# Patient Record
Sex: Female | Born: 1950 | Race: White | Hispanic: No | Marital: Married | Smoking: Never smoker
Health system: Southern US, Community
[De-identification: ages and names within clinical notes are randomized; demographics above are authoritative.]

## PROBLEM LIST (undated history)

## (undated) DIAGNOSIS — E042 Nontoxic multinodular goiter: Secondary | ICD-10-CM

## (undated) DIAGNOSIS — J45909 Unspecified asthma, uncomplicated: Secondary | ICD-10-CM

## (undated) DIAGNOSIS — I10 Essential (primary) hypertension: Secondary | ICD-10-CM

## (undated) DIAGNOSIS — Z8719 Personal history of other diseases of the digestive system: Secondary | ICD-10-CM

## (undated) DIAGNOSIS — J189 Pneumonia, unspecified organism: Secondary | ICD-10-CM

## (undated) DIAGNOSIS — E039 Hypothyroidism, unspecified: Secondary | ICD-10-CM

## (undated) DIAGNOSIS — M5430 Sciatica, unspecified side: Secondary | ICD-10-CM

## (undated) DIAGNOSIS — E785 Hyperlipidemia, unspecified: Secondary | ICD-10-CM

## (undated) DIAGNOSIS — G4730 Sleep apnea, unspecified: Secondary | ICD-10-CM

## (undated) DIAGNOSIS — M8580 Other specified disorders of bone density and structure, unspecified site: Secondary | ICD-10-CM

## (undated) DIAGNOSIS — K219 Gastro-esophageal reflux disease without esophagitis: Secondary | ICD-10-CM

## (undated) DIAGNOSIS — E063 Autoimmune thyroiditis: Secondary | ICD-10-CM

## (undated) DIAGNOSIS — M1990 Unspecified osteoarthritis, unspecified site: Secondary | ICD-10-CM

## (undated) HISTORY — PX: ECTOPIC PREGNANCY SURGERY: SHX613

## (undated) HISTORY — PX: COLONOSCOPY: SHX174

## (undated) HISTORY — PX: UPPER GI ENDOSCOPY: SHX6162

---

## 1997-12-22 ENCOUNTER — Encounter: Payer: Self-pay | Admitting: Obstetrics and Gynecology

## 1997-12-22 ENCOUNTER — Ambulatory Visit (HOSPITAL_COMMUNITY): Admission: AD | Admit: 1997-12-22 | Discharge: 1997-12-22 | Payer: Self-pay | Admitting: Obstetrics and Gynecology

## 1998-01-22 ENCOUNTER — Other Ambulatory Visit: Admission: RE | Admit: 1998-01-22 | Discharge: 1998-01-22 | Payer: Self-pay | Admitting: Obstetrics and Gynecology

## 1998-08-19 ENCOUNTER — Inpatient Hospital Stay (HOSPITAL_COMMUNITY): Admission: AD | Admit: 1998-08-19 | Discharge: 1998-08-21 | Payer: Self-pay | Admitting: Obstetrics and Gynecology

## 1998-10-09 ENCOUNTER — Other Ambulatory Visit: Admission: RE | Admit: 1998-10-09 | Discharge: 1998-10-09 | Payer: Self-pay | Admitting: Obstetrics and Gynecology

## 2000-01-16 ENCOUNTER — Ambulatory Visit (HOSPITAL_COMMUNITY): Admission: RE | Admit: 2000-01-16 | Discharge: 2000-01-16 | Payer: Self-pay | Admitting: Gastroenterology

## 2000-01-27 ENCOUNTER — Other Ambulatory Visit: Admission: RE | Admit: 2000-01-27 | Discharge: 2000-01-27 | Payer: Self-pay | Admitting: Obstetrics and Gynecology

## 2001-06-14 ENCOUNTER — Other Ambulatory Visit: Admission: RE | Admit: 2001-06-14 | Discharge: 2001-06-14 | Payer: Self-pay | Admitting: Obstetrics and Gynecology

## 2003-02-27 ENCOUNTER — Other Ambulatory Visit: Admission: RE | Admit: 2003-02-27 | Discharge: 2003-02-27 | Payer: Self-pay | Admitting: Obstetrics and Gynecology

## 2004-04-24 ENCOUNTER — Other Ambulatory Visit: Admission: RE | Admit: 2004-04-24 | Discharge: 2004-04-24 | Payer: Self-pay | Admitting: Obstetrics and Gynecology

## 2007-06-07 ENCOUNTER — Ambulatory Visit (HOSPITAL_COMMUNITY): Admission: RE | Admit: 2007-06-07 | Discharge: 2007-06-07 | Payer: Self-pay | Admitting: Obstetrics and Gynecology

## 2007-08-04 ENCOUNTER — Encounter (INDEPENDENT_AMBULATORY_CARE_PROVIDER_SITE_OTHER): Payer: Self-pay | Admitting: Interventional Radiology

## 2007-08-04 ENCOUNTER — Encounter: Admission: RE | Admit: 2007-08-04 | Discharge: 2007-08-04 | Payer: Self-pay | Admitting: Surgery

## 2007-08-04 ENCOUNTER — Other Ambulatory Visit: Admission: RE | Admit: 2007-08-04 | Discharge: 2007-08-04 | Payer: Self-pay | Admitting: Interventional Radiology

## 2009-07-23 ENCOUNTER — Encounter: Admission: RE | Admit: 2009-07-23 | Discharge: 2009-07-23 | Payer: Self-pay | Admitting: Obstetrics and Gynecology

## 2011-01-26 ENCOUNTER — Encounter (INDEPENDENT_AMBULATORY_CARE_PROVIDER_SITE_OTHER): Payer: Self-pay | Admitting: Surgery

## 2011-07-06 ENCOUNTER — Encounter (INDEPENDENT_AMBULATORY_CARE_PROVIDER_SITE_OTHER): Payer: Self-pay

## 2012-01-04 ENCOUNTER — Other Ambulatory Visit (HOSPITAL_COMMUNITY): Payer: Self-pay | Admitting: Obstetrics and Gynecology

## 2012-01-04 DIAGNOSIS — Z8639 Personal history of other endocrine, nutritional and metabolic disease: Secondary | ICD-10-CM

## 2012-01-06 ENCOUNTER — Ambulatory Visit (HOSPITAL_COMMUNITY): Payer: Self-pay

## 2012-01-12 ENCOUNTER — Ambulatory Visit (HOSPITAL_COMMUNITY)
Admission: RE | Admit: 2012-01-12 | Discharge: 2012-01-12 | Disposition: A | Discharge status: Home / Self Care | Payer: BC Managed Care – PPO | Source: Ambulatory Visit | Attending: Obstetrics and Gynecology | Admitting: Obstetrics and Gynecology

## 2012-01-12 DIAGNOSIS — Z8639 Personal history of other endocrine, nutritional and metabolic disease: Secondary | ICD-10-CM | POA: Insufficient documentation

## 2012-01-12 DIAGNOSIS — E049 Nontoxic goiter, unspecified: Secondary | ICD-10-CM | POA: Insufficient documentation

## 2012-01-12 DIAGNOSIS — Z862 Personal history of diseases of the blood and blood-forming organs and certain disorders involving the immune mechanism: Secondary | ICD-10-CM | POA: Insufficient documentation

## 2012-03-22 ENCOUNTER — Encounter: Payer: Self-pay | Admitting: Physician Assistant

## 2012-03-22 ENCOUNTER — Ambulatory Visit (INDEPENDENT_AMBULATORY_CARE_PROVIDER_SITE_OTHER): Payer: BC Managed Care – PPO | Admitting: Physician Assistant

## 2012-03-22 DIAGNOSIS — R079 Chest pain, unspecified: Secondary | ICD-10-CM

## 2012-03-22 NOTE — Patient Instructions (Addendum)
Your physician has requested that you have a lexiscan myoview DX ABN GXT. For further information please visit [email or web address] Please follow instruction sheet, as given.

## 2012-03-22 NOTE — Progress Notes (Signed)
Melissa Collier is a 62 y.o. female referred by PCP for ETT.  She has no hx of CAD.  PMH:  HTN, HL, hypothyroidism.  SHx: non-smoker. 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  FHx:  + CAD (dad with MI in 13s).  Patient reports 6 mos hx of DOE.  Not getting worse.  Also notes a couple episodes of lightheadedness (not related to exertion).  She has chest tightness associated with dyspnea.  No syncope.  Exam unremarkable.  BP markedly elevated:  169/107. Patient took Losartan/HCTZ 100/12.5 mg last night at bedtime.  Exercise Treadmill Test  Pre-Exercise Testing Evaluation Rhythm: normal sinus  Rate: 75                 Test  Exercise Tolerance Test Ordering MD: Burnell Blanks, MD  Interpreting MD: Tereso Newcomer, PA-C  Unique Test No: 1  Treadmill:  1  Indication for ETT: chest pain - rule out ischemia  Contraindication to ETT: No   Stress Modality: exercise - treadmill  Cardiac Imaging Performed: non   Protocol: standard Bruce - maximal  Max BP:  227/96  Max MPHR (bpm):  158 85% MPR (bpm):  134  MPHR obtained (bpm):  141 % MPHR obtained:  89%  Reached 85% MPHR (min:sec):  4:44 Total Exercise Time (min-sec):  5:00  Workload in METS:  7.0 Borg Scale: 14  Reason ETT Terminated:  exaggerated hypertensive response    ST Segment Analysis At Rest: non-specific ST segment slurring With Exercise: borderline ST changes  Other Information Arrhythmia:  No Angina during ETT:  absent (0) Quality of ETT:  indeterminate  ETT Interpretation:  borderline (indeterminate) with non-specific ST changes  Comments: Fair exercise tolerance. No chest pain. Hypertensive BP response to exercise. Borderline ST-T changes - cannot rule out ischemia.   Recommendations: Reviewed ECGs with Dr. Tonny Bollman (DOD). Will arrange [employer]. Recommend close follow up with PCP for further BP management. Signed,  Tereso Newcomer, PA-C  1:45 PM 03/22/2012

## 2012-03-29 ENCOUNTER — Ambulatory Visit (HOSPITAL_COMMUNITY): Payer: BC Managed Care – PPO | Attending: Internal Medicine | Admitting: Radiology

## 2012-03-29 VITALS — BP 166/99 | HR 62 | Ht 66.0 in | Wt 154.0 lb

## 2012-03-29 DIAGNOSIS — R42 Dizziness and giddiness: Secondary | ICD-10-CM | POA: Insufficient documentation

## 2012-03-29 DIAGNOSIS — I10 Essential (primary) hypertension: Secondary | ICD-10-CM | POA: Insufficient documentation

## 2012-03-29 DIAGNOSIS — R0789 Other chest pain: Secondary | ICD-10-CM | POA: Insufficient documentation

## 2012-03-29 DIAGNOSIS — R0609 Other forms of dyspnea: Secondary | ICD-10-CM | POA: Insufficient documentation

## 2012-03-29 DIAGNOSIS — R0989 Other specified symptoms and signs involving the circulatory and respiratory systems: Secondary | ICD-10-CM | POA: Insufficient documentation

## 2012-03-29 DIAGNOSIS — R9439 Abnormal result of other cardiovascular function study: Secondary | ICD-10-CM

## 2012-03-29 DIAGNOSIS — R0602 Shortness of breath: Secondary | ICD-10-CM

## 2012-03-29 DIAGNOSIS — R079 Chest pain, unspecified: Secondary | ICD-10-CM

## 2012-03-29 MED ORDER — TECHNETIUM TC 99M SESTAMIBI GENERIC - CARDIOLITE
10.0000 | Freq: Once | INTRAVENOUS | Status: AC | PRN
  Administered 2012-03-29: 10 via INTRAVENOUS

## 2012-03-29 MED ORDER — REGADENOSON 0.4 MG/5ML IV SOLN
0.4000 mg | Freq: Once | INTRAVENOUS | Status: AC
  Administered 2012-03-29: 0.4 mg via INTRAVENOUS

## 2012-03-29 MED ORDER — TECHNETIUM TC 99M SESTAMIBI GENERIC - CARDIOLITE
30.0000 | Freq: Once | INTRAVENOUS | Status: AC | PRN
  Administered 2012-03-29: 30 via INTRAVENOUS

## 2012-03-29 NOTE — Progress Notes (Signed)
[facility] SITE 3 NUCLEAR MED [address] [place], [place] [address] [phone]    Cardiology Nuclear Med Study  Melissa Collier is a 62 y.o. female     MRN : [id number]     DOB: [birth year]  Procedure Date: 03/29/2012  Nuclear Med Background Indication for Stress Test:  Evaluation for Ischemia and Abnormal GXT History:  03/22/12 [id number] ST changes Cardiac Risk Factors: Family History - CAD, Hypertension and Lipids  Symptoms:  Chest Tightness with Exertion (last episode of chest discomfort was yesterday), Dizziness, DOE, Fatigue with Exertion and Light-Headedness   Nuclear Pre-Procedure Caffeine/Decaff Intake:  None > 12 hrs NPO After: 3:00am   Lungs:  Clear. O2 Sat: 98% on room air. IV 0.9% NS with Angio Cath:  20g  IV Site: R Antecubital x 1, tolerated well IV Started by:  Irean Hong, RN  Chest Size (in):  36 Cup Size: C  Height: 5\' 6"  (1.676 m)  Weight:  154 lb (69.854 kg)  BMI:  Body mass index is 24.86 kg/(m^2). Tech Comments:  No medications today; losartan last night    Nuclear Med Study 1 or 2 day study: 1 day  Stress Test Type:  Treadmill/Lexiscan  Reading MD: Arvilla Meres, MD  Order Authorizing Provider:  Tonny Bollman, MD, and Tereso Newcomer, PA-C  Resting Radionuclide: Technetium 62m Sestamibi  Resting Radionuclide Dose: 8.9 mCi   Stress Radionuclide:  Technetium 59m Sestamibi  Stress Radionuclide Dose: 32.3 mCi           Stress Protocol Rest HR: 62 Stress HR: 113  Rest BP: 166/99 Stress BP: 196/92  Exercise Time (min): 2:00 METS: n/a   Predicted Max HR: 158 bpm % Max HR: 71.52 bpm Rate Pressure Product: 66440    Dose of Adenosine (mg):  n/a Dose of Lexiscan: 0.4 mg  Dose of Atropine (mg): n/a Dose of Dobutamine: n/a mcg/kg/min (at max HR)  Stress Test Technologist: Smiley Houseman, CMA-N  Nuclear Technologist:  Domenic Polite, CNMT     Rest Procedure:  Myocardial perfusion imaging was performed at rest 45 minutes  following the intravenous administration of Technetium 55m Sestamibi.  Rest ECG: NSR with no ST changes.  Stress Procedure:  The patient received IV Lexiscan 0.4 mg over 15-seconds with concurrent low level exercise and then Technetium 64m Sestamibi was injected at 30-seconds while the patient continued walking one more minute.  Quantitative spect images were obtained after a 45-minute delay.  Stress ECG: No significant ST segment change suggestive of ischemia.  QPS Raw Data Images:  There is interference from nuclear activity from structures below the diaphragm. This does not affect the ability to read the study. Stress Images:  Normal homogeneous uptake in all areas of the myocardium. Rest Images:  Normal homogeneous uptake in all areas of the myocardium. Subtraction (SDS):  No evidence of ischemia. Transient Ischemic Dilatation (Normal <1.22):  0.90 Lung/Heart Ratio (Normal <0.45):  0.21  Quantitative Gated Spect Images QGS EDV:  84 ml QGS ESV:  28 ml  Impression Exercise Capacity:  Lexiscan with low level exercise. BP Response:  Normal blood pressure response. Clinical Symptoms:  There is dyspnea. ECG Impression:  No significant ST segment change suggestive of ischemia. Comparison with Prior Nuclear Study: No previous nuclear study performed  Overall Impression:  Normal stress nuclear study.  LV Ejection Fraction: 66%.  LV Wall Motion:  NL LV Function; NL Wall Motion  Melissa Collier

## 2012-04-01 ENCOUNTER — Telehealth: Payer: Self-pay | Admitting: *Deleted

## 2012-04-01 NOTE — Telephone Encounter (Signed)
Message copied by Tarri Fuller on Fri Apr 01, 2012 10:15 AM ------      Message from: [place], [place] T      Created: Fri Apr 01, 2012  8:32 AM       Please inform patient stress test normal.      Tereso Newcomer, PA-C  8:32 AM 04/01/2012

## 2012-04-01 NOTE — Telephone Encounter (Signed)
pt's husband notified about normal myoview results, he verbalized understanding to me

## 2013-02-21 ENCOUNTER — Other Ambulatory Visit: Payer: Self-pay | Admitting: Family Medicine

## 2013-02-21 ENCOUNTER — Ambulatory Visit
Admission: RE | Admit: 2013-02-21 | Discharge: 2013-02-21 | Disposition: A | Discharge status: Home / Self Care | Payer: BC Managed Care – PPO | Source: Ambulatory Visit | Attending: Family Medicine | Admitting: Family Medicine

## 2013-02-21 DIAGNOSIS — M25561 Pain in right knee: Secondary | ICD-10-CM

## 2014-03-14 ENCOUNTER — Ambulatory Visit (HOSPITAL_BASED_OUTPATIENT_CLINIC_OR_DEPARTMENT_OTHER): Payer: BC Managed Care – PPO | Attending: Family Medicine | Admitting: Radiology

## 2014-03-14 VITALS — Ht 66.0 in | Wt 147.0 lb

## 2014-03-14 DIAGNOSIS — G4733 Obstructive sleep apnea (adult) (pediatric): Secondary | ICD-10-CM | POA: Diagnosis not present

## 2014-03-24 DIAGNOSIS — G4733 Obstructive sleep apnea (adult) (pediatric): Secondary | ICD-10-CM

## 2014-03-24 NOTE — Sleep Study (Signed)
   NAME: Melissa ScullJacqueline A Casso DATE OF BIRTH:  08/26/1950 MEDICAL RECORD NUMBER [id number]  LOCATION: [facility]  PHYSICIAN: Merranda Bolls D  DATE OF STUDY: 03/14/2014  SLEEP STUDY TYPE: Nocturnal Polysomnogram               REFERRING PHYSICIAN: Hamrick, Maura L, MD  INDICATION FOR STUDY: Insomnia with sleep apnea  EPWORTH SLEEPINESS SCORE:   14/24 HEIGHT: 5\' 6"  (167.6 cm)  WEIGHT: 147 lb (66.679 kg)    Body mass index is 23.74 kg/(m^2).  NECK SIZE: 13.5 in.  MEDICATIONS: Charted for review  SLEEP ARCHITECTURE: Total sleep time 275.5 minutes with sleep efficiency 75.7%. Stage I was 12%, stage II 78.2%, stage III absent, REM 9.8% of total sleep time. Sleep latency 22.5 minutes, REM latency 211 minutes, awake after sleep onset 66 minutes, arousal index 24.4, bedtime medication: None  RESPIRATORY DATA: Apnea hypopnea index (AHI) 8.3 per hour. 38 total events scored including 4 obstructive apneas and 34 hypopneas. Most events were while sleeping nonsupine. REM AHI 22.2 per hour. This study was ordered as a diagnostic study without CPAP.  OXYGEN DATA: Moderate snoring with oxygen desaturation to a nadir of 90% and mean saturation 94.5% on room air  CARDIAC DATA: Sinus rhythm with occasional PVC  MOVEMENT/PARASOMNIA: Occasional limb jerks with little sleep disturbance, insignificant. Bathroom 1  IMPRESSION/ RECOMMENDATION:   1) Mild obstructive sleep apnea/hypopnea syndrome, AHI 8.3 per hour with events more common while nonsupine. REM AHI 22.2 per hour. Moderate snoring with oxygen desaturation to a nadir of 90% and mean saturation 94.5% on room air. 2) Scores in this range may respond to conservative management including weight loss, encouraged to sleep off flat of back and management of any significant nasal congestion or upper airway obstruction, if appropriate. On an individual basis, other interventions including CPAP or an oral appliance may be considered.    [name],[name] D Diplomate, American Board of Sleep Medicine  ELECTRONICALLY SIGNED ON:  03/24/2014, 4:05 PM [facility] PH: [phone]   FX: [phone] ACCREDITED BY THE AMERICAN ACADEMY OF SLEEP MEDICINE

## 2016-08-21 ENCOUNTER — Other Ambulatory Visit (HOSPITAL_COMMUNITY): Payer: Self-pay | Admitting: Obstetrics and Gynecology

## 2016-08-21 DIAGNOSIS — E041 Nontoxic single thyroid nodule: Secondary | ICD-10-CM

## 2016-08-26 ENCOUNTER — Ambulatory Visit (HOSPITAL_COMMUNITY)
Admission: RE | Admit: 2016-08-26 | Discharge: 2016-08-26 | Disposition: A | Discharge status: Home / Self Care | Payer: BC Managed Care – PPO | Source: Ambulatory Visit | Attending: Obstetrics and Gynecology | Admitting: Obstetrics and Gynecology

## 2016-08-26 DIAGNOSIS — E041 Nontoxic single thyroid nodule: Secondary | ICD-10-CM | POA: Diagnosis not present

## 2017-01-06 ENCOUNTER — Other Ambulatory Visit: Payer: Self-pay | Admitting: Family Medicine

## 2017-01-06 DIAGNOSIS — M5432 Sciatica, left side: Secondary | ICD-10-CM

## 2017-01-26 ENCOUNTER — Ambulatory Visit
Admission: RE | Admit: 2017-01-26 | Discharge: 2017-01-26 | Disposition: A | Discharge status: Home / Self Care | Payer: BC Managed Care – PPO | Source: Ambulatory Visit | Attending: Family Medicine | Admitting: Family Medicine

## 2017-01-26 DIAGNOSIS — M5432 Sciatica, left side: Secondary | ICD-10-CM

## 2018-10-07 ENCOUNTER — Other Ambulatory Visit: Payer: Self-pay | Admitting: Obstetrics and Gynecology

## 2018-10-07 DIAGNOSIS — E041 Nontoxic single thyroid nodule: Secondary | ICD-10-CM

## 2018-10-11 ENCOUNTER — Ambulatory Visit (HOSPITAL_COMMUNITY): Payer: BC Managed Care – PPO

## 2018-10-25 ENCOUNTER — Other Ambulatory Visit: Payer: Self-pay

## 2018-10-25 ENCOUNTER — Ambulatory Visit (HOSPITAL_COMMUNITY)
Admission: RE | Admit: 2018-10-25 | Discharge: 2018-10-25 | Disposition: A | Discharge status: Home / Self Care | Payer: BC Managed Care – PPO | Source: Ambulatory Visit | Attending: Obstetrics and Gynecology | Admitting: Obstetrics and Gynecology

## 2018-10-25 DIAGNOSIS — E041 Nontoxic single thyroid nodule: Secondary | ICD-10-CM | POA: Diagnosis present

## 2019-01-10 ENCOUNTER — Ambulatory Visit
Admission: RE | Admit: 2019-01-10 | Discharge: 2019-01-10 | Disposition: A | Discharge status: Home / Self Care | Payer: Medicare Other | Source: Ambulatory Visit | Attending: Family Medicine | Admitting: Family Medicine

## 2019-01-10 ENCOUNTER — Other Ambulatory Visit: Payer: Self-pay | Admitting: Family Medicine

## 2019-01-10 DIAGNOSIS — Z01811 Encounter for preprocedural respiratory examination: Secondary | ICD-10-CM

## 2019-01-11 NOTE — H&P (Signed)
TOTAL KNEE ADMISSION H&P  Patient is being admitted for right total knee arthroplasty.  Subjective:  Chief Complaint:  Right knee primary OA / pain  HPI: Melissa Collier, 68 y.o. female, has a history of pain and functional disability in the right knee due to arthritis and has failed non-surgical conservative treatments for greater than 12 weeks to include NSAID's and/or analgesics, corticosteriod injections, viscosupplementation injections and activity modification.  Onset of symptoms was gradual, starting 10 years ago with gradually worsening course since that time. The patient noted no past surgery on the right knee(s).  Patient currently rates pain in the right knee(s) at 5 out of 10 with activity. Patient has night pain, worsening of pain with activity and weight bearing, pain that interferes with activities of daily living, pain with passive range of motion, crepitus and joint swelling.  Patient has evidence of periarticular osteophytes and joint space narrowing by imaging studies.  There is no active infection.  Risks, benefits and expectations were discussed with the patient.  Risks including but not limited to the risk of anesthesia, blood clots, nerve damage, blood vessel damage, failure of the prosthesis, infection and up to and including death.  Patient understand the risks, benefits and expectations and wishes to proceed with surgery.   PCP: Darrow Bussing, MD  D/C Plans:       Home   Post-op Meds:       No Rx given   Tranexamic Acid:      To be given - IV   Decadron:      Is to be given  FYI:      ASA  Norco  DME:   Pt equipment  Rxs sent for - RW & 3-n-1  PT:   OPPT    Pharmacy: [facility] [place]     Past Medical History:  Diagnosis Date  . Arthritis   . Asthma   . GERD (gastroesophageal reflux disease)   . Hashimoto's thyroiditis   . History of hiatal hernia   . Hyperlipidemia   . Hypertension   . Hypothyroidism   . Multinodular goiter   . Osteopenia   .  Pneumonia    History of  . Sciatica    Left greater than right  . Sleep apnea    Mild, NO CPAP    Past Surgical History:  Procedure Laterality Date  . COLONOSCOPY    . DIAGNOSTIC LAPAROSCOPY  1991   x2  . ECTOPIC PREGNANCY SURGERY    . UPPER GI ENDOSCOPY      No current facility-administered medications for this encounter.    Current Outpatient Medications  Medication Sig Dispense Refill Last Dose  . albuterol (VENTOLIN HFA) 108 (90 Base) MCG/ACT inhaler Inhale 2 puffs into the lungs every 4 (four) hours as needed.     [name] Kitchen amLODipine (NORVASC) 2.5 MG tablet Take 2.5 mg by mouth daily.     [name] Kitchen atorvastatin (LIPITOR) 20 MG tablet Take 20 mg by mouth at bedtime.     . calcium carbonate (CALCIUM 600) 600 MG TABS tablet Take 600 mg by mouth daily with breakfast.     . levothyroxine (SYNTHROID) 75 MCG tablet Take 75 mg by mouth daily.     . meloxicam (MOBIC) 15 MG tablet Take 15 mg by mouth daily as needed for pain.     [name] Kitchen omeprazole (PRILOSEC) 40 MG capsule Take 40 mg by mouth at bedtime.      . valsartan-hydrochlorothiazide (DIOVAN-HCT) 320-25 MG tablet Take 1 tablet  by mouth daily.      Allergies  Allergen Reactions  . Dust Mite Extract Itching and Nausea And Vomiting    Cat /  causes runny eyes     Social History   Tobacco Use  . Smoking status: Never Smoker  . Smokeless tobacco: Never Used  Substance Use Topics  . Alcohol use: Yes    Comment: Wine       Review of Systems  Constitutional: Negative.   HENT: Negative.   Eyes: Negative.   Respiratory: Negative.   Cardiovascular: Negative.   Gastrointestinal: Positive for heartburn.  Genitourinary: Negative.   Musculoskeletal: Positive for joint pain.  Skin: Negative.   Neurological: Negative.   Endo/Heme/Allergies: Negative.   Psychiatric/Behavioral: Negative.     Objective:  Physical Exam  Constitutional: She is oriented to person, place, and time. She appears well-developed.  HENT:  Head: Normocephalic.   Eyes: Pupils are equal, round, and reactive to light.  Neck: Neck supple. No JVD present. No tracheal deviation present. No thyromegaly present.  Cardiovascular: Normal rate, regular rhythm and intact distal pulses.  Respiratory: Effort normal and breath sounds normal. No respiratory distress. She has no wheezes.  GI: Soft. There is no abdominal tenderness. There is no guarding.  Musculoskeletal:     Right knee: She exhibits swelling and bony tenderness. She exhibits no ecchymosis, no deformity, no laceration and no erythema. Tenderness found.  Lymphadenopathy:    She has no cervical adenopathy.  Neurological: She is alert and oriented to person, place, and time.  Skin: Skin is warm and dry.  Psychiatric: She has a normal mood and affect.      Labs:  Estimated body mass index is 25.43 kg/m as calculated from the following:   Height as of 01/25/19: 5' 5.5" (1.664 m).   Weight as of 01/25/19: 70.4 kg.   Imaging Review Plain radiographs demonstrate severe degenerative joint disease of the right knee.  The bone quality appears to be good for age and reported activity level.      Assessment/Plan:  End stage arthritis, right knee   The patient history, physical examination, clinical judgment of the provider and imaging studies are consistent with end stage degenerative joint disease of the right knee and total knee arthroplasty is deemed medically necessary. The treatment options including medical management, injection therapy arthroscopy and arthroplasty were discussed at length. The risks and benefits of total knee arthroplasty were presented and reviewed. The risks due to aseptic loosening, infection, stiffness, patella tracking problems, thromboembolic complications and other imponderables were discussed. The patient acknowledged the explanation, agreed to proceed with the plan and consent was signed. Patient is being admitted for inpatient treatment for surgery, pain control, PT,  OT, prophylactic antibiotics, VTE prophylaxis, progressive ambulation and ADL's and discharge planning. The patient is planning to be discharged home.     Patient's anticipated LOS is less than 2 midnights, meeting these requirements: - Lives within 1 hour of care - Has a competent adult at home to recover with post-op recover - NO history of  - Chronic pain requiring opiods  - Diabetes  - Coronary Artery Disease  - Heart failure  - Heart attack  - Stroke  - DVT/VTE  - Cardiac arrhythmia  - Respiratory Failure/COPD  - Renal failure  - Anemia  - Advanced Liver disease   Melissa Collier. Melissa Carmickle   PA-C  01/30/2019, 8:06 PM

## 2019-01-24 ENCOUNTER — Encounter (HOSPITAL_COMMUNITY): Payer: Self-pay

## 2019-01-24 NOTE — Patient Instructions (Addendum)
DUE TO COVID-19 ONLY ONE VISITOR IS ALLOWED TO COME WITH YOU AND STAY IN THE WAITING ROOM ONLY DURING PRE OP AND PROCEDURE. THE ONE VISITOR MAY VISIT WITH YOU IN YOUR PRIVATE ROOM DURING VISITING HOURS ONLY!!   COVID SWAB TESTING MUST BE COMPLETED ON:  Friday, Nov. 27, 2020 at  1055 AM [address], [place] -Former [facility] enter pre surgical testing line (Must self quarantine after testing. Follow instructions on handout.)         Your procedure is scheduled on: Tuesday, Dec. 1, 2020    Report to [facility] Main  Entrance    Report to admitting at 6:10 AM   Call this number if you have problems the morning of surgery [phone]   Do not eat food: After Midnight.   May have liquids until 5:40 AM day of surgery   CLEAR LIQUID DIET  Foods Allowed                                                                     Foods Excluded  Water, Black Coffee and tea, regular and decaf                             liquids that you cannot  Plain Jell-O in any flavor  (No red)                                           see through such as: Fruit ices (not with fruit pulp)                                     milk, soups, orange juice  Iced Popsicles (No red)                                    All solid food Carbonated beverages, regular and diet                                    Apple juices Sports drinks like Gatorade (No red) Lightly seasoned clear broth or consume(fat free) Sugar, honey syrup  Sample Menu Breakfast                                Lunch                                     Supper Cranberry juice                    Beef broth                            Chicken broth Jell-O  Grape juice                           Apple juice Coffee or tea                        Jell-O                                      Popsicle                                                Coffee or tea                        Coffee or  tea   Complete one Ensure drink the morning of surgery at 5:40 AM the day of surgery.   Brush your teeth the morning of surgery.   Do NOT smoke after Midnight   Take these medicines the morning of surgery with A SIP OF WATER: Amlodipine, Lebothyroxine   Bring rescue inhaler day of surgery                               You may not have any metal on your body including hair pins, jewelry, and body piercings             Do not wear make-up, lotions, powders, perfumes/cologne, or deodorant             Do not wear nail polish.  Do not shave  48 hours prior to surgery.               Do not bring valuables to the hospital. [place].   Contacts, dentures or bridgework may not be worn into surgery.   Bring small overnight bag day of surgery.    Patients discharged the day of surgery will not be allowed to drive home.   Special Instructions: Bring a copy of your healthcare power of attorney and living will documents         the day of surgery if you haven't scanned them in before.              Please read over the following fact sheets you were given:  [facility] - Preparing for Surgery Before surgery, you can play an important role.  Because skin is not sterile, your skin needs to be as free of germs as possible.  You can reduce the number of germs on your skin by washing with CHG (chlorahexidine gluconate) soap before surgery.  CHG is an antiseptic cleaner which kills germs and bonds with the skin to continue killing germs even after washing. Please DO NOT use if you have an allergy to CHG or antibacterial soaps.  If your skin becomes reddened/irritated stop using the CHG and inform your nurse when you arrive at Short Stay. Do not shave (including legs and underarms) for at least 48 hours prior to the first CHG shower.  You may shave your face/neck.  Please follow these instructions carefully:  1.  Shower with CHG Soap the night before  surgery and the  morning of surgery.  2.  If you choose to wash your hair, wash your hair first as usual with your normal  shampoo.  3.  After you shampoo, rinse your hair and body thoroughly to remove the shampoo.                             4.  Use CHG as you would any other liquid soap.  You can apply chg directly to the skin and wash.  Gently with a scrungie or clean washcloth.  5.  Apply the CHG Soap to your body ONLY FROM THE NECK DOWN.   Do   not use on face/ open                           Wound or open sores. Avoid contact with eyes, ears mouth and   genitals (private parts).                       Wash face,  Genitals (private parts) with your normal soap.             6.  Wash thoroughly, paying special attention to the area where your    surgery  will be performed.  7.  Thoroughly rinse your body with warm water from the neck down.  8.  DO NOT shower/wash with your normal soap after using and rinsing off the CHG Soap.                9.  Pat yourself dry with a clean towel.            10.  Wear clean pajamas.            11.  Place clean sheets on your bed the night of your first shower and do not  sleep with pets. Day of Surgery : Do not apply any lotions/deodorants the morning of surgery.  Please wear clean clothes to the hospital/surgery center.  FAILURE TO FOLLOW THESE INSTRUCTIONS MAY RESULT IN THE CANCELLATION OF YOUR SURGERY  PATIENT SIGNATURE_________________________________  NURSE SIGNATURE__________________________________  ________________________________________________________________________   Melissa Collier  An incentive spirometer is a tool that can help keep your lungs clear and active. This tool measures how well you are filling your lungs with each breath. Taking long deep breaths may help reverse or decrease the chance of developing breathing (pulmonary) problems (especially infection) following:  A long period of time when you are unable to move or be  active. BEFORE THE PROCEDURE   If the spirometer includes an indicator to show your best effort, your nurse or respiratory therapist will set it to a desired goal.  If possible, sit up straight or lean slightly forward. Try not to slouch.  Hold the incentive spirometer in an upright position. INSTRUCTIONS FOR USE  1. Sit on the edge of your bed if possible, or sit up as far as you can in bed or on a chair. 2. Hold the incentive spirometer in an upright position. 3. Breathe out normally. 4. Place the mouthpiece in your mouth and seal your lips tightly around it. 5. Breathe in slowly and as deeply as possible, raising the piston or the ball toward the top of the column. 6. Hold your breath for 3-5 seconds or for as long as possible. Allow the piston  or ball to fall to the bottom of the column. 7. Remove the mouthpiece from your mouth and breathe out normally. 8. Rest for a few seconds and repeat Steps 1 through 7 at least 10 times every 1-2 hours when you are awake. Take your time and take a few normal breaths between deep breaths. 9. The spirometer may include an indicator to show your best effort. Use the indicator as a goal to work toward during each repetition. 10. After each set of 10 deep breaths, practice coughing to be sure your lungs are clear. If you have an incision (the cut made at the time of surgery), support your incision when coughing by placing a pillow or rolled up towels firmly against it. Once you are able to get out of bed, walk around indoors and cough well. You may stop using the incentive spirometer when instructed by your caregiver.  RISKS AND COMPLICATIONS  Take your time so you do not get dizzy or light-headed.  If you are in pain, you may need to take or ask for pain medication before doing incentive spirometry. It is harder to take a deep breath if you are having pain. AFTER USE  Rest and breathe slowly and easily.  It can be helpful to keep track of a log of  your progress. Your caregiver can provide you with a simple table to help with this. If you are using the spirometer at home, follow these instructions: [place] IF:   You are having difficultly using the spirometer.  You have trouble using the spirometer as often as instructed.  Your pain medication is not giving enough relief while using the spirometer.  You develop fever of 100.5 F (38.1 C) or higher. SEEK IMMEDIATE MEDICAL CARE IF:   You cough up bloody sputum that had not been present before.  You develop fever of 102 F (38.9 C) or greater.  You develop worsening pain at or near the incision site. MAKE SURE YOU:   Understand these instructions.  Will watch your condition.  Will get help right away if you are not doing well or get worse. Document Released: 06/29/2006 Document Revised: 05/11/2011 Document Reviewed: 08/30/2006 ExitCare Patient Information 2014 ExitCare, [place].   ________________________________________________________________________  WHAT IS A BLOOD TRANSFUSION? Blood Transfusion Information  A transfusion is the replacement of blood or some of its parts. Blood is made up of multiple cells which provide different functions.  Red blood cells carry oxygen and are used for blood loss replacement.  White blood cells fight against infection.  Platelets control bleeding.  Plasma helps clot blood.  Other blood products are available for specialized needs, such as hemophilia or other clotting disorders. BEFORE THE TRANSFUSION  Who gives blood for transfusions?   Healthy volunteers who are fully evaluated to make sure their blood is safe. This is blood bank blood. Transfusion therapy is the safest it has ever been in the practice of medicine. Before blood is taken from a donor, a complete history is taken to make sure that person has no history of diseases nor engages in risky social behavior (examples are intravenous drug use or sexual activity  with multiple partners). The donor's travel history is screened to minimize risk of transmitting infections, such as malaria. The donated blood is tested for signs of infectious diseases, such as HIV and hepatitis. The blood is then tested to be sure it is compatible with you in order to minimize the chance of a transfusion reaction. If you  or a relative donates blood, this is often done in anticipation of surgery and is not appropriate for emergency situations. It takes many days to process the donated blood. RISKS AND COMPLICATIONS Although transfusion therapy is very safe and saves many lives, the main dangers of transfusion include:   Getting an infectious disease.  Developing a transfusion reaction. This is an allergic reaction to something in the blood you were given. Every precaution is taken to prevent this. The decision to have a blood transfusion has been considered carefully by your caregiver before blood is given. Blood is not given unless the benefits outweigh the risks. AFTER THE TRANSFUSION  Right after receiving a blood transfusion, you will usually feel much better and more energetic. This is especially true if your red blood cells have gotten low (anemic). The transfusion raises the level of the red blood cells which carry oxygen, and this usually causes an energy increase.  The nurse administering the transfusion will monitor you carefully for complications. HOME CARE INSTRUCTIONS  No special instructions are needed after a transfusion. You may find your energy is better. Speak with your caregiver about any limitations on activity for underlying diseases you may have. SEEK MEDICAL CARE IF:   Your condition is not improving after your transfusion.  You develop redness or irritation at the intravenous (IV) site. SEEK IMMEDIATE MEDICAL CARE IF:  Any of the following symptoms occur over the next 12 hours:  Shaking chills.  You have a temperature by mouth above 102 F (38.9  C), not controlled by medicine.  Chest, back, or muscle pain.  People around you feel you are not acting correctly or are confused.  Shortness of breath or difficulty breathing.  Dizziness and fainting.  You get a rash or develop hives.  You have a decrease in urine output.  Your urine turns a dark color or changes to pink, red, or brown. Any of the following symptoms occur over the next 10 days:  You have a temperature by mouth above 102 F (38.9 C), not controlled by medicine.  Shortness of breath.  Weakness after normal activity.  The white part of the eye turns yellow (jaundice).  You have a decrease in the amount of urine or are urinating less often.  Your urine turns a dark color or changes to pink, red, or brown. Document Released: 02/14/2000 Document Revised: 05/11/2011 Document Reviewed: 10/03/2007 [facility] Patient Information 2014 [place], [place].  _______________________________________________________________________

## 2019-01-25 ENCOUNTER — Other Ambulatory Visit: Payer: Self-pay

## 2019-01-25 ENCOUNTER — Encounter (HOSPITAL_COMMUNITY): Payer: Self-pay

## 2019-01-25 ENCOUNTER — Encounter (HOSPITAL_COMMUNITY)
Admission: RE | Admit: 2019-01-25 | Discharge: 2019-01-25 | Disposition: A | Discharge status: Home / Self Care | Payer: BC Managed Care – PPO | Source: Ambulatory Visit | Attending: Orthopedic Surgery | Admitting: Orthopedic Surgery

## 2019-01-25 DIAGNOSIS — Z01818 Encounter for other preprocedural examination: Secondary | ICD-10-CM | POA: Insufficient documentation

## 2019-01-25 HISTORY — DX: Personal history of other diseases of the digestive system: Z87.19

## 2019-01-25 HISTORY — DX: Hypothyroidism, unspecified: E03.9

## 2019-01-25 HISTORY — DX: Sleep apnea, unspecified: G47.30

## 2019-01-25 HISTORY — DX: Nontoxic multinodular goiter: E04.2

## 2019-01-25 HISTORY — DX: Pneumonia, unspecified organism: J18.9

## 2019-01-25 HISTORY — DX: Autoimmune thyroiditis: E06.3

## 2019-01-25 HISTORY — DX: Other specified disorders of bone density and structure, unspecified site: M85.80

## 2019-01-25 HISTORY — DX: Gastro-esophageal reflux disease without esophagitis: K21.9

## 2019-01-25 HISTORY — DX: Unspecified osteoarthritis, unspecified site: M19.90

## 2019-01-25 HISTORY — DX: Sciatica, unspecified side: M54.30

## 2019-01-25 HISTORY — DX: Essential (primary) hypertension: I10

## 2019-01-25 HISTORY — DX: Unspecified asthma, uncomplicated: J45.909

## 2019-01-25 HISTORY — DX: Hyperlipidemia, unspecified: E78.5

## 2019-01-25 LAB — CBC
HCT: 42.6 % (ref 36.0–46.0)
Hemoglobin: 13.9 g/dL (ref 12.0–15.0)
MCH: 31.4 pg (ref 26.0–34.0)
MCHC: 32.6 g/dL (ref 30.0–36.0)
MCV: 96.2 fL (ref 80.0–100.0)
Platelets: 218 10*3/uL (ref 150–400)
RBC: 4.43 MIL/uL (ref 3.87–5.11)
RDW: 12.4 % (ref 11.5–15.5)
WBC: 6.4 10*3/uL (ref 4.0–10.5)
nRBC: 0 % (ref 0.0–0.2)

## 2019-01-25 LAB — BASIC METABOLIC PANEL
Anion gap: 11 (ref 5–15)
BUN: 21 mg/dL (ref 8–23)
CO2: 24 mmol/L (ref 22–32)
Calcium: 9.7 mg/dL (ref 8.9–10.3)
Chloride: 104 mmol/L (ref 98–111)
Creatinine, Ser: 1.06 mg/dL — ABNORMAL HIGH (ref 0.44–1.00)
GFR calc Af Amer: >60 mL/min (ref >60)
GFR calc non Af Amer: 54 mL/min — ABNORMAL LOW (ref >60)
Glucose, Bld: 100 mg/dL — ABNORMAL HIGH (ref 70–99)
Potassium: 3.5 mmol/L (ref 3.5–5.1)
Sodium: 139 mmol/L (ref 135–145)

## 2019-01-25 LAB — ABO/RH: ABO/RH(D): A POS

## 2019-01-25 NOTE — Progress Notes (Signed)
PCP - Dr. Andria Frames Cardiologist - N/A  Chest x-ray - 01/20/2019 in epic EKG - 01/25/2019 in epic Stress Test - greater than 2 years ECHO - N/A Cardiac Cath - N/A  Sleep Study - 03/14/2014 in epic CPAP - N/A  Fasting Blood Sugar - N/A Checks Blood Sugar N/A_____ times a day  Blood Thinner Instructions:  N/A Aspirin Instructions:  N/A Last Dose:  N/A  Anesthesia review:  N/A  Patient denies shortness of breath, fever, cough and chest pain at PAT appointment   Patient verbalized understanding of instructions that were given to them at the PAT appointment. Patient was also instructed that they will need to review over the PAT instructions again at home before surgery.

## 2019-01-26 LAB — SURGICAL PCR SCREEN
MRSA, PCR: POSITIVE — AB
Staphylococcus aureus: POSITIVE — AB

## 2019-01-27 ENCOUNTER — Other Ambulatory Visit (HOSPITAL_COMMUNITY)
Admission: RE | Admit: 2019-01-27 | Discharge: 2019-01-27 | Disposition: A | Discharge status: Home / Self Care | Payer: BC Managed Care – PPO | Source: Ambulatory Visit | Attending: Orthopedic Surgery | Admitting: Orthopedic Surgery

## 2019-01-27 DIAGNOSIS — Z01812 Encounter for preprocedural laboratory examination: Secondary | ICD-10-CM | POA: Diagnosis present

## 2019-01-27 DIAGNOSIS — Z20828 Contact with and (suspected) exposure to other viral communicable diseases: Secondary | ICD-10-CM | POA: Diagnosis not present

## 2019-01-27 LAB — SARS CORONAVIRUS 2 (TAT 6-24 HRS): SARS Coronavirus 2: NEGATIVE

## 2019-01-30 NOTE — Anesthesia Preprocedure Evaluation (Addendum)
Anesthesia Evaluation  Patient identified by MRN, date of birth, ID band Patient awake    Reviewed: Allergy & Precautions, NPO status , Patient's Chart, lab work & pertinent test results  History of Anesthesia Complications Negative for: history of anesthetic complications  Airway Mallampati: II  TM Distance: >3 FB Neck ROM: Full    Dental  (+) Dental Advisory Given   Pulmonary sleep apnea (mild, does not require CPAP) , COPD,  COPD inhaler,  01/27/2019 SARS CoV2 NEG   breath sounds clear to auscultation       Cardiovascular hypertension, Pt. on medications (-) angina Rhythm:Regular Rate:Normal  '14 Stress test: Normal   Neuro/Psych negative neurological ROS     GI/Hepatic Neg liver ROS, GERD  Medicated and Controlled,  Endo/Other  Hypothyroidism   Renal/GU negative Renal ROS     Musculoskeletal  (+) Arthritis , Osteoarthritis,    Abdominal   Peds  Hematology negative hematology ROS (+)   Anesthesia Other Findings   Reproductive/Obstetrics                            Anesthesia Physical Anesthesia Plan  ASA: II  Anesthesia Plan: Spinal   Post-op Pain Management:  Regional for Post-op pain   Induction:   PONV Risk Score and Plan: 2 and Ondansetron and Dexamethasone  Airway Management Planned: Natural Airway and Simple Face Mask  Additional Equipment: None  Intra-op Plan:   Post-operative Plan:   Informed Consent: I have reviewed the patients History and Physical, chart, labs and discussed the procedure including the risks, benefits and alternatives for the proposed anesthesia with the patient or authorized representative who has indicated his/her understanding and acceptance.     Dental advisory given  Plan Discussed with: CRNA and Surgeon  Anesthesia Plan Comments: (Plan routine monitors, SAB with adductor canal block for post op pain)       Anesthesia Quick  Evaluation

## 2019-01-31 ENCOUNTER — Ambulatory Visit (HOSPITAL_COMMUNITY): Payer: BC Managed Care – PPO | Admitting: Certified Registered"

## 2019-01-31 ENCOUNTER — Other Ambulatory Visit: Payer: Self-pay

## 2019-01-31 ENCOUNTER — Encounter (HOSPITAL_COMMUNITY)
Admission: RE | Disposition: A | Discharge status: Home / Self Care | Payer: Self-pay | Source: Home / Self Care | Attending: Orthopedic Surgery

## 2019-01-31 ENCOUNTER — Ambulatory Visit (HOSPITAL_COMMUNITY): Payer: BC Managed Care – PPO | Admitting: Physician Assistant

## 2019-01-31 ENCOUNTER — Encounter (HOSPITAL_COMMUNITY): Payer: Self-pay | Admitting: *Deleted

## 2019-01-31 ENCOUNTER — Observation Stay (HOSPITAL_COMMUNITY)
Admission: RE | Admit: 2019-01-31 | Discharge: 2019-02-01 | Disposition: A | Discharge status: Home / Self Care | Payer: BC Managed Care – PPO | Attending: Orthopedic Surgery | Admitting: Orthopedic Surgery

## 2019-01-31 DIAGNOSIS — E063 Autoimmune thyroiditis: Secondary | ICD-10-CM | POA: Insufficient documentation

## 2019-01-31 DIAGNOSIS — K219 Gastro-esophageal reflux disease without esophagitis: Secondary | ICD-10-CM | POA: Diagnosis not present

## 2019-01-31 DIAGNOSIS — E785 Hyperlipidemia, unspecified: Secondary | ICD-10-CM | POA: Insufficient documentation

## 2019-01-31 DIAGNOSIS — M1711 Unilateral primary osteoarthritis, right knee: Principal | ICD-10-CM | POA: Insufficient documentation

## 2019-01-31 DIAGNOSIS — Z79899 Other long term (current) drug therapy: Secondary | ICD-10-CM | POA: Insufficient documentation

## 2019-01-31 DIAGNOSIS — M25461 Effusion, right knee: Secondary | ICD-10-CM | POA: Diagnosis not present

## 2019-01-31 DIAGNOSIS — I10 Essential (primary) hypertension: Secondary | ICD-10-CM | POA: Insufficient documentation

## 2019-01-31 DIAGNOSIS — Z79890 Hormone replacement therapy (postmenopausal): Secondary | ICD-10-CM | POA: Insufficient documentation

## 2019-01-31 DIAGNOSIS — M659 Synovitis and tenosynovitis, unspecified: Secondary | ICD-10-CM | POA: Insufficient documentation

## 2019-01-31 DIAGNOSIS — G4730 Sleep apnea, unspecified: Secondary | ICD-10-CM | POA: Insufficient documentation

## 2019-01-31 DIAGNOSIS — Z96651 Presence of right artificial knee joint: Secondary | ICD-10-CM

## 2019-01-31 DIAGNOSIS — M25761 Osteophyte, right knee: Secondary | ICD-10-CM | POA: Diagnosis not present

## 2019-01-31 DIAGNOSIS — J449 Chronic obstructive pulmonary disease, unspecified: Secondary | ICD-10-CM | POA: Diagnosis not present

## 2019-01-31 DIAGNOSIS — E663 Overweight: Secondary | ICD-10-CM | POA: Diagnosis present

## 2019-01-31 HISTORY — PX: TOTAL KNEE ARTHROPLASTY: SHX125

## 2019-01-31 LAB — TYPE AND SCREEN
ABO/RH(D): A POS
Antibody Screen: NEGATIVE

## 2019-01-31 SURGERY — ARTHROPLASTY, KNEE, TOTAL
Anesthesia: Spinal | Site: Knee | Laterality: Right

## 2019-01-31 MED ORDER — BUPIVACAINE-EPINEPHRINE (PF) 0.25% -1:200000 IJ SOLN
INTRAMUSCULAR | Status: DC | PRN
  Administered 2019-01-31: 30 mL

## 2019-01-31 MED ORDER — VANCOMYCIN HCL IN DEXTROSE 1-5 GM/200ML-% IV SOLN
1000.0000 mg | INTRAVENOUS | Status: AC
  Administered 2019-01-31 (×2): 1000 mg via INTRAVENOUS

## 2019-01-31 MED ORDER — OMEPRAZOLE 20 MG PO CPDR
40.0000 mg | DELAYED_RELEASE_CAPSULE | Freq: Every day | ORAL | Status: DC
  Administered 2019-01-31: 40 mg via ORAL
  Filled 2019-01-31: qty 2

## 2019-01-31 MED ORDER — PROPOFOL 500 MG/50ML IV EMUL
INTRAVENOUS | Status: AC
  Filled 2019-01-31: qty 100

## 2019-01-31 MED ORDER — PROPOFOL 10 MG/ML IV BOLUS
INTRAVENOUS | Status: AC
  Filled 2019-01-31: qty 20

## 2019-01-31 MED ORDER — HYDROCHLOROTHIAZIDE 25 MG PO TABS
25.0000 mg | ORAL_TABLET | Freq: Every day | ORAL | Status: DC
  Administered 2019-02-01: 25 mg via ORAL
  Filled 2019-01-31: qty 1

## 2019-01-31 MED ORDER — CEFAZOLIN SODIUM-DEXTROSE 2-4 GM/100ML-% IV SOLN
2.0000 g | Freq: Four times a day (QID) | INTRAVENOUS | Status: AC
  Administered 2019-01-31 (×2): 2 g via INTRAVENOUS
  Filled 2019-01-31 (×2): qty 100

## 2019-01-31 MED ORDER — HYDROMORPHONE HCL 1 MG/ML IJ SOLN: 0.2500 mg | INTRAMUSCULAR | Status: DC | PRN

## 2019-01-31 MED ORDER — KETOROLAC TROMETHAMINE 30 MG/ML IJ SOLN
INTRAMUSCULAR | Status: DC | PRN
  Administered 2019-01-31: 30 mg via INTRAVENOUS

## 2019-01-31 MED ORDER — METHOCARBAMOL 500 MG IVPB - SIMPLE MED
500.0000 mg | Freq: Four times a day (QID) | INTRAVENOUS | Status: DC | PRN
  Administered 2019-01-31: 11:00:00 500 mg via INTRAVENOUS
  Filled 2019-01-31: qty 50

## 2019-01-31 MED ORDER — PHENOL 1.4 % MT LIQD: 1.0000 | OROMUCOSAL | Status: DC | PRN

## 2019-01-31 MED ORDER — CEFAZOLIN SODIUM-DEXTROSE 2-4 GM/100ML-% IV SOLN
INTRAVENOUS | Status: AC
  Filled 2019-01-31: qty 100

## 2019-01-31 MED ORDER — VANCOMYCIN HCL IN DEXTROSE 1-5 GM/200ML-% IV SOLN
INTRAVENOUS | Status: AC
  Administered 2019-01-31: 1000 mg via INTRAVENOUS
  Filled 2019-01-31: qty 200

## 2019-01-31 MED ORDER — ASPIRIN 81 MG PO CHEW
81.0000 mg | CHEWABLE_TABLET | Freq: Two times a day (BID) | ORAL | Status: DC
  Administered 2019-01-31 – 2019-02-01 (×2): 81 mg via ORAL
  Filled 2019-01-31 (×2): qty 1

## 2019-01-31 MED ORDER — PROMETHAZINE HCL 25 MG/ML IJ SOLN: 6.2500 mg | INTRAMUSCULAR | Status: DC | PRN

## 2019-01-31 MED ORDER — ACETAMINOPHEN 325 MG PO TABS: 325.0000 mg | ORAL_TABLET | Freq: Four times a day (QID) | ORAL | Status: DC | PRN

## 2019-01-31 MED ORDER — KETOROLAC TROMETHAMINE 30 MG/ML IJ SOLN
INTRAMUSCULAR | Status: AC
  Filled 2019-01-31: qty 1

## 2019-01-31 MED ORDER — METHOCARBAMOL 500 MG PO TABS
500.0000 mg | ORAL_TABLET | Freq: Four times a day (QID) | ORAL | Status: DC | PRN
  Administered 2019-01-31 – 2019-02-01 (×3): 500 mg via ORAL
  Filled 2019-01-31 (×3): qty 1

## 2019-01-31 MED ORDER — SODIUM CHLORIDE 0.9 % IV SOLN
INTRAVENOUS | Status: DC
  Administered 2019-02-01: 01:00:00 via INTRAVENOUS

## 2019-01-31 MED ORDER — MEPERIDINE HCL 50 MG/ML IJ SOLN: 6.2500 mg | INTRAMUSCULAR | Status: DC | PRN

## 2019-01-31 MED ORDER — DEXAMETHASONE SODIUM PHOSPHATE 10 MG/ML IJ SOLN
10.0000 mg | Freq: Once | INTRAMUSCULAR | Status: AC
  Administered 2019-01-31: 09:00:00 10 mg via INTRAVENOUS

## 2019-01-31 MED ORDER — DIPHENHYDRAMINE HCL 12.5 MG/5ML PO ELIX: 12.5000 mg | ORAL_SOLUTION | ORAL | Status: DC | PRN

## 2019-01-31 MED ORDER — DEXAMETHASONE SODIUM PHOSPHATE 10 MG/ML IJ SOLN
10.0000 mg | Freq: Once | INTRAMUSCULAR | Status: AC
  Administered 2019-02-01: 10 mg via INTRAVENOUS
  Filled 2019-01-31: qty 1

## 2019-01-31 MED ORDER — HYDROCODONE-ACETAMINOPHEN 7.5-325 MG PO TABS
1.0000 | ORAL_TABLET | ORAL | Status: DC | PRN
  Administered 2019-01-31 – 2019-02-01 (×4): 2 via ORAL
  Filled 2019-01-31: qty 1
  Filled 2019-01-31 (×4): qty 2

## 2019-01-31 MED ORDER — POLYETHYLENE GLYCOL 3350 17 G PO PACK
17.0000 g | PACK | Freq: Two times a day (BID) | ORAL | Status: DC
  Administered 2019-01-31 – 2019-02-01 (×2): 17 g via ORAL
  Filled 2019-01-31 (×2): qty 1

## 2019-01-31 MED ORDER — ALBUTEROL SULFATE (2.5 MG/3ML) 0.083% IN NEBU: 2.5000 mg | INHALATION_SOLUTION | RESPIRATORY_TRACT | Status: DC | PRN

## 2019-01-31 MED ORDER — FENTANYL CITRATE (PF) 100 MCG/2ML IJ SOLN
50.0000 ug | Freq: Once | INTRAMUSCULAR | Status: AC
  Administered 2019-01-31: 08:00:00 50 ug via INTRAVENOUS

## 2019-01-31 MED ORDER — BUPIVACAINE IN DEXTROSE 0.75-8.25 % IT SOLN
INTRATHECAL | Status: DC | PRN
  Administered 2019-01-31: 12 mg via INTRATHECAL

## 2019-01-31 MED ORDER — VANCOMYCIN HCL IN DEXTROSE 1-5 GM/200ML-% IV SOLN: 1000.0000 mg | Freq: Once | INTRAVENOUS | Status: DC

## 2019-01-31 MED ORDER — POVIDONE-IODINE 10 % EX SWAB
2.0000 "application " | Freq: Once | CUTANEOUS | Status: AC
  Administered 2019-01-31: 2 via TOPICAL

## 2019-01-31 MED ORDER — MIDAZOLAM HCL 2 MG/2ML IJ SOLN
INTRAMUSCULAR | Status: AC
  Administered 2019-01-31: 1 mg via INTRAVENOUS
  Filled 2019-01-31: qty 2

## 2019-01-31 MED ORDER — LACTATED RINGERS IV SOLN
INTRAVENOUS | Status: DC
  Administered 2019-01-31 (×3): via INTRAVENOUS

## 2019-01-31 MED ORDER — AMLODIPINE BESYLATE 5 MG PO TABS
2.5000 mg | ORAL_TABLET | Freq: Every day | ORAL | Status: DC
  Administered 2019-02-01: 2.5 mg via ORAL
  Filled 2019-01-31: qty 1

## 2019-01-31 MED ORDER — SODIUM CHLORIDE 0.9 % IR SOLN
Status: DC | PRN
  Administered 2019-01-31: 1000 mL

## 2019-01-31 MED ORDER — PROPOFOL 500 MG/50ML IV EMUL
INTRAVENOUS | Status: DC | PRN
  Administered 2019-01-31: 30 ug/kg/min via INTRAVENOUS

## 2019-01-31 MED ORDER — MIDAZOLAM HCL 2 MG/2ML IJ SOLN
1.0000 mg | Freq: Once | INTRAMUSCULAR | Status: AC
  Administered 2019-01-31: 08:00:00 1 mg via INTRAVENOUS

## 2019-01-31 MED ORDER — ONDANSETRON HCL 4 MG/2ML IJ SOLN: 4.0000 mg | Freq: Four times a day (QID) | INTRAMUSCULAR | Status: DC | PRN

## 2019-01-31 MED ORDER — TRANEXAMIC ACID-NACL 1000-0.7 MG/100ML-% IV SOLN
INTRAVENOUS | Status: AC
  Filled 2019-01-31: qty 100

## 2019-01-31 MED ORDER — ONDANSETRON HCL 4 MG PO TABS: 4.0000 mg | ORAL_TABLET | Freq: Four times a day (QID) | ORAL | Status: DC | PRN

## 2019-01-31 MED ORDER — BISACODYL 10 MG RE SUPP: 10.0000 mg | Freq: Every day | RECTAL | Status: DC | PRN

## 2019-01-31 MED ORDER — ROPIVACAINE HCL 7.5 MG/ML IJ SOLN
INTRAMUSCULAR | Status: DC | PRN
  Administered 2019-01-31: 20 mL via PERINEURAL

## 2019-01-31 MED ORDER — VALSARTAN-HYDROCHLOROTHIAZIDE 320-25 MG PO TABS: 1.0000 | ORAL_TABLET | Freq: Every day | ORAL | Status: DC

## 2019-01-31 MED ORDER — STERILE WATER FOR IRRIGATION IR SOLN
Status: DC | PRN
  Administered 2019-01-31 (×2): 1000 mL

## 2019-01-31 MED ORDER — SODIUM CHLORIDE (PF) 0.9 % IJ SOLN
INTRAMUSCULAR | Status: DC | PRN
  Administered 2019-01-31: 30 mL

## 2019-01-31 MED ORDER — PHENYLEPHRINE HCL-NACL 10-0.9 MG/250ML-% IV SOLN
INTRAVENOUS | Status: DC | PRN
  Administered 2019-01-31: 30 ug/min via INTRAVENOUS

## 2019-01-31 MED ORDER — MIDAZOLAM HCL 2 MG/2ML IJ SOLN: 0.5000 mg | Freq: Once | INTRAMUSCULAR | Status: DC | PRN

## 2019-01-31 MED ORDER — METHOCARBAMOL 500 MG IVPB - SIMPLE MED
INTRAVENOUS | Status: AC
  Administered 2019-01-31: 500 mg via INTRAVENOUS
  Filled 2019-01-31: qty 50

## 2019-01-31 MED ORDER — CHLORHEXIDINE GLUCONATE 4 % EX LIQD: 60.0000 mL | Freq: Once | CUTANEOUS | Status: DC

## 2019-01-31 MED ORDER — FERROUS SULFATE 325 (65 FE) MG PO TABS
325.0000 mg | ORAL_TABLET | Freq: Two times a day (BID) | ORAL | Status: DC
  Administered 2019-01-31 – 2019-02-01 (×2): 325 mg via ORAL
  Filled 2019-01-31 (×2): qty 1

## 2019-01-31 MED ORDER — LIDOCAINE 2% (20 MG/ML) 5 ML SYRINGE
INTRAMUSCULAR | Status: DC | PRN
  Administered 2019-01-31: 50 mg via INTRAVENOUS

## 2019-01-31 MED ORDER — CEFAZOLIN SODIUM-DEXTROSE 2-4 GM/100ML-% IV SOLN
2.0000 g | INTRAVENOUS | Status: AC
  Administered 2019-01-31: 2 g via INTRAVENOUS

## 2019-01-31 MED ORDER — ONDANSETRON HCL 4 MG/2ML IJ SOLN
INTRAMUSCULAR | Status: DC | PRN
  Administered 2019-01-31: 4 mg via INTRAVENOUS

## 2019-01-31 MED ORDER — TRANEXAMIC ACID-NACL 1000-0.7 MG/100ML-% IV SOLN
1000.0000 mg | INTRAVENOUS | Status: AC
  Administered 2019-01-31: 1000 mg via INTRAVENOUS

## 2019-01-31 MED ORDER — METOCLOPRAMIDE HCL 5 MG PO TABS: 5.0000 mg | ORAL_TABLET | Freq: Three times a day (TID) | ORAL | Status: DC | PRN

## 2019-01-31 MED ORDER — ATORVASTATIN CALCIUM 20 MG PO TABS
20.0000 mg | ORAL_TABLET | Freq: Every day | ORAL | Status: DC
  Administered 2019-01-31: 20 mg via ORAL
  Filled 2019-01-31: qty 1

## 2019-01-31 MED ORDER — TRANEXAMIC ACID-NACL 1000-0.7 MG/100ML-% IV SOLN
1000.0000 mg | Freq: Once | INTRAVENOUS | Status: AC
  Administered 2019-01-31: 1000 mg via INTRAVENOUS
  Filled 2019-01-31: qty 100

## 2019-01-31 MED ORDER — ALUM & MAG HYDROXIDE-SIMETH 200-200-20 MG/5ML PO SUSP: 15.0000 mL | ORAL | Status: DC | PRN

## 2019-01-31 MED ORDER — PHENYLEPHRINE 40 MCG/ML (10ML) SYRINGE FOR IV PUSH (FOR BLOOD PRESSURE SUPPORT)
PREFILLED_SYRINGE | INTRAVENOUS | Status: DC | PRN
  Administered 2019-01-31: 80 ug via INTRAVENOUS
  Administered 2019-01-31: 120 ug via INTRAVENOUS
  Administered 2019-01-31: 80 ug via INTRAVENOUS

## 2019-01-31 MED ORDER — MENTHOL 3 MG MT LOZG: 1.0000 | LOZENGE | OROMUCOSAL | Status: DC | PRN

## 2019-01-31 MED ORDER — IRBESARTAN 150 MG PO TABS
300.0000 mg | ORAL_TABLET | Freq: Every day | ORAL | Status: DC
  Administered 2019-02-01: 300 mg via ORAL
  Filled 2019-01-31: qty 2

## 2019-01-31 MED ORDER — FENTANYL CITRATE (PF) 100 MCG/2ML IJ SOLN
INTRAMUSCULAR | Status: AC
  Administered 2019-01-31: 50 ug via INTRAVENOUS
  Filled 2019-01-31: qty 2

## 2019-01-31 MED ORDER — SODIUM CHLORIDE (PF) 0.9 % IJ SOLN
INTRAMUSCULAR | Status: AC
  Filled 2019-01-31: qty 50

## 2019-01-31 MED ORDER — HYDROCODONE-ACETAMINOPHEN 5-325 MG PO TABS
1.0000 | ORAL_TABLET | ORAL | Status: DC | PRN
  Administered 2019-01-31: 2 via ORAL
  Filled 2019-01-31: qty 2

## 2019-01-31 MED ORDER — MAGNESIUM CITRATE PO SOLN: 1.0000 | Freq: Once | ORAL | Status: DC | PRN

## 2019-01-31 MED ORDER — DOCUSATE SODIUM 100 MG PO CAPS
100.0000 mg | ORAL_CAPSULE | Freq: Two times a day (BID) | ORAL | Status: DC
  Administered 2019-01-31 – 2019-02-01 (×2): 100 mg via ORAL
  Filled 2019-01-31 (×2): qty 1

## 2019-01-31 MED ORDER — CELECOXIB 200 MG PO CAPS
200.0000 mg | ORAL_CAPSULE | Freq: Two times a day (BID) | ORAL | Status: DC
  Administered 2019-01-31 – 2019-02-01 (×2): 200 mg via ORAL
  Filled 2019-01-31 (×2): qty 1

## 2019-01-31 MED ORDER — LEVOTHYROXINE SODIUM 75 MCG PO TABS: 75.0000 ug | ORAL_TABLET | Freq: Every day | ORAL | Status: AC

## 2019-01-31 MED ORDER — PROPOFOL 10 MG/ML IV BOLUS
INTRAVENOUS | Status: DC | PRN
  Administered 2019-01-31: 10 mg via INTRAVENOUS
  Administered 2019-01-31 (×2): 20 mg via INTRAVENOUS

## 2019-01-31 MED ORDER — HYDROMORPHONE HCL 1 MG/ML IJ SOLN
0.5000 mg | INTRAMUSCULAR | Status: DC | PRN
  Administered 2019-01-31 (×2): 1 mg via INTRAVENOUS
  Filled 2019-01-31 (×2): qty 1

## 2019-01-31 MED ORDER — BUPIVACAINE-EPINEPHRINE 0.25% -1:200000 IJ SOLN
INTRAMUSCULAR | Status: AC
  Filled 2019-01-31: qty 1

## 2019-01-31 MED ORDER — LEVOTHYROXINE SODIUM 75 MCG PO TABS
75.0000 ug | ORAL_TABLET | Freq: Every day | ORAL | Status: DC
  Administered 2019-02-01: 75 ug via ORAL
  Filled 2019-01-31: qty 1

## 2019-01-31 MED ORDER — METOCLOPRAMIDE HCL 5 MG/ML IJ SOLN: 5.0000 mg | Freq: Three times a day (TID) | INTRAMUSCULAR | Status: DC | PRN

## 2019-01-31 SURGICAL SUPPLY — 62 items
ADH SKN CLS APL DERMABOND .7 (GAUZE/BANDAGES/DRESSINGS) ×1
ATTUNE MED ANAT PAT 35 KNEE (Knees) ×1 IMPLANT
ATTUNE PSFEM RTSZ5 NARCEM KNEE (Femur) ×2 IMPLANT
ATTUNE PSRP INSE SZ5 7 KNEE (Insert) ×1 IMPLANT
BAG SPEC THK2 15X12 ZIP CLS (MISCELLANEOUS)
BAG ZIPLOCK 12X15 (MISCELLANEOUS) IMPLANT
BASEPLATE TIBIAL ROTATING SZ 4 (Knees) ×1 IMPLANT
BLADE SAW SGTL 11.0X1.19X90.0M (BLADE) IMPLANT
BLADE SAW SGTL 13.0X1.19X90.0M (BLADE) ×2 IMPLANT
BLADE SURG SZ10 CARB STEEL (BLADE) ×4 IMPLANT
BNDG ELASTIC 6X5.8 VLCR STR LF (GAUZE/BANDAGES/DRESSINGS) ×2 IMPLANT
BOWL SMART MIX CTS (DISPOSABLE) ×2 IMPLANT
BSPLAT TIB 4 CMNT ROT PLAT STR (Knees) ×1 IMPLANT
CEMENT HV SMART SET (Cement) ×4 IMPLANT
COVER SURGICAL LIGHT HANDLE (MISCELLANEOUS) ×2 IMPLANT
COVER WAND RF STERILE (DRAPES) IMPLANT
CUFF TOURN SGL QUICK 34 (TOURNIQUET CUFF) ×2
CUFF TRNQT CYL 34X4.125X (TOURNIQUET CUFF) ×1 IMPLANT
DECANTER SPIKE VIAL GLASS SM (MISCELLANEOUS) ×4 IMPLANT
DERMABOND ADVANCED (GAUZE/BANDAGES/DRESSINGS) ×1
DERMABOND ADVANCED .7 DNX12 (GAUZE/BANDAGES/DRESSINGS) ×1 IMPLANT
DRAPE U-SHAPE 47X51 STRL (DRAPES) ×2 IMPLANT
DRESSING AQUACEL AG SP 3.5X10 (GAUZE/BANDAGES/DRESSINGS) ×1 IMPLANT
DRSG AQUACEL AG SP 3.5X10 (GAUZE/BANDAGES/DRESSINGS) ×2
DURAPREP 26ML APPLICATOR (WOUND CARE) ×4 IMPLANT
ELECT REM PT RETURN 15FT ADLT (MISCELLANEOUS) ×2 IMPLANT
GLOVE BIO SURGEON STRL SZ 6 (GLOVE) ×2 IMPLANT
GLOVE BIOGEL PI IND STRL 6.5 (GLOVE) ×1 IMPLANT
GLOVE BIOGEL PI IND STRL 7.5 (GLOVE) ×1 IMPLANT
GLOVE BIOGEL PI IND STRL 8.5 (GLOVE) ×1 IMPLANT
GLOVE BIOGEL PI INDICATOR 6.5 (GLOVE) ×1
GLOVE BIOGEL PI INDICATOR 7.5 (GLOVE) ×1
GLOVE BIOGEL PI INDICATOR 8.5 (GLOVE) ×1
GLOVE ECLIPSE 8.0 STRL XLNG CF (GLOVE) ×2 IMPLANT
GLOVE ORTHO TXT STRL SZ7.5 (GLOVE) ×2 IMPLANT
GOWN STRL REUS W/ TWL LRG LVL3 (GOWN DISPOSABLE) ×1 IMPLANT
GOWN STRL REUS W/TWL 2XL LVL3 (GOWN DISPOSABLE) ×2 IMPLANT
GOWN STRL REUS W/TWL LRG LVL3 (GOWN DISPOSABLE) ×4 IMPLANT
HANDPIECE INTERPULSE COAX TIP (DISPOSABLE) ×2
HOLDER FOLEY CATH W/STRAP (MISCELLANEOUS) IMPLANT
KIT TURNOVER KIT A (KITS) IMPLANT
MANIFOLD NEPTUNE II (INSTRUMENTS) ×2 IMPLANT
NDL SAFETY ECLIPSE 18X1.5 (NEEDLE) IMPLANT
NEEDLE HYPO 18GX1.5 SHARP (NEEDLE)
NS IRRIG 1000ML POUR BTL (IV SOLUTION) ×2 IMPLANT
PACK TOTAL KNEE CUSTOM (KITS) ×2 IMPLANT
PENCIL SMOKE EVACUATOR (MISCELLANEOUS) IMPLANT
PIN FIX SIGMA LCS THRD HI (PIN) ×1 IMPLANT
PIN THREADED HEADED SIGMA (PIN) ×2 IMPLANT
PROTECTOR NERVE ULNAR (MISCELLANEOUS) ×2 IMPLANT
SET HNDPC FAN SPRY TIP SCT (DISPOSABLE) ×1 IMPLANT
SET PAD KNEE POSITIONER (MISCELLANEOUS) ×2 IMPLANT
SUT MNCRL AB 4-0 PS2 18 (SUTURE) ×2 IMPLANT
SUT STRATAFIX PDS+ 0 24IN (SUTURE) ×2 IMPLANT
SUT VIC AB 1 CT1 36 (SUTURE) ×2 IMPLANT
SUT VIC AB 2-0 CT1 27 (SUTURE) ×6
SUT VIC AB 2-0 CT1 TAPERPNT 27 (SUTURE) ×3 IMPLANT
SYR 3ML LL SCALE MARK (SYRINGE) ×2 IMPLANT
TRAY FOLEY MTR SLVR 16FR STAT (SET/KITS/TRAYS/PACK) ×2 IMPLANT
WATER STERILE IRR 1000ML POUR (IV SOLUTION) ×4 IMPLANT
WRAP KNEE MAXI GEL POST OP (GAUZE/BANDAGES/DRESSINGS) ×2 IMPLANT
YANKAUER SUCT BULB TIP 10FT TU (MISCELLANEOUS) ×2 IMPLANT

## 2019-01-31 NOTE — Evaluation (Signed)
Physical Therapy Evaluation Patient Details Name: Melissa Collier MRN: [id number] DOB: Sep 30, 1950 Today's Date: 01/31/2019   History of Present Illness  Patient is 68 y.o. female s/p Rt TKA on 01/31/19 with PMH significant for osteopenia, HTN, HLD, hypothyroidism, GERD, OA, asthma.    Clinical Impression  Melissa Collier is a 68 y.o. female POD 0 s/p Rt TKA. Patient reports independence with mobility at baseline. Patient is now limited by functional impairments (see PT problem list below) and requires min assist for transfers and gait with RW. Patient was able to ambulate ~70 feet with RW and min assist with cues for safe management. Patient instructed in exercise to facilitate ROM and circulation. Patient will benefit from continued skilled PT interventions to address impairments and progress towards PLOF. Acute PT will follow to progress mobility and stair training in preparation for safe discharge home.    Follow Up Recommendations Follow surgeon's recommendation for DC plan and follow-up therapies    Equipment Recommendations  Rolling walker with 5" wheels;3in1 (PT)    Recommendations for Other Services       Precautions / Restrictions Precautions Precautions: Fall Restrictions Weight Bearing Restrictions: No      Mobility  Bed Mobility Overal bed mobility: Needs Assistance Bed Mobility: Supine to Sit     Supine to sit: HOB elevated;Min assist     General bed mobility comments: cues for use of bed rails, assist for LE management and to scoto to EOB.  Transfers Overall transfer level: Needs assistance Equipment used: Rolling walker (2 wheeled) Transfers: Sit to/from Stand Sit to Stand: Min assist         General transfer comment: cues for safe hand placement and technique with RW, assist to initiate power up and steady with rising  Ambulation/Gait Ambulation/Gait assistance: Min assist Gait Distance (Feet): 70 Feet Assistive device: Rolling walker (2  wheeled) Gait Pattern/deviations: Step-to pattern;Decreased stride length;Decreased stance time - right;Decreased step length - left;Narrow base of support Gait velocity: decreased   General Gait Details: verbal/tactile cues for safe step pattern/sequencing and to maintain wider BOS, pt requried cues to maintain safe proximity to RW during turns. no overt LOB noted.  Stairs            Wheelchair Mobility    Modified Rankin (Stroke Patients Only)       Balance Overall balance assessment: Needs assistance Sitting-balance support: No upper extremity supported;Feet supported Sitting balance-Leahy Scale: Good     Standing balance support: During functional activity;Bilateral upper extremity supported Standing balance-Leahy Scale: Poor            Pertinent Vitals/Pain Pain Assessment: 0-10 Pain Score: 7  Pain Location: Rt knee Pain Descriptors / Indicators: Aching Pain Intervention(s): Repositioned;Limited activity within patient's tolerance;Ice applied;Monitored during session    Home Living Family/patient expects to be discharged to:: Private residence Living Arrangements: Spouse/significant other Available Help at Discharge: Family;Available 24 hours/day Type of Home: House Home Access: Stairs to enter Entrance Stairs-Rails: None Entrance Stairs-Number of Steps: 3 at front no rails (+ threshold) Home Layout: One level Home Equipment: None      Prior Function Level of Independence: Independent         Comments: pt enjoys horse back riding and is looking forward to getting back to that once recovered     Hand Dominance   Dominant Hand: Right    Extremity/Trunk Assessment   Upper Extremity Assessment Upper Extremity Assessment: Overall WFL for tasks assessed    Lower Extremity Assessment Lower  Extremity Assessment: Overall WFL for tasks assessed;RLE deficits/detail RLE Deficits / Details: pt with good quad activation with no extensor lag with SLR,  4/5 for quad strength with MMT RLE Sensation: decreased proprioception RLE Coordination: (pt with some difficulty coordinating foot placement)    Cervical / Trunk Assessment Cervical / Trunk Assessment: Normal  Communication   Communication: No difficulties  Cognition Arousal/Alertness: Awake/alert Behavior During Therapy: WFL for tasks assessed/performed Overall Cognitive Status: Within Functional Limits for tasks assessed             General Comments      Exercises Total Joint Exercises Ankle Circles/Pumps: AROM;10 reps;Seated;Both Quad Sets: AROM;10 reps;Seated;Right Heel Slides: AAROM;10 reps;Seated;Right   Assessment/Plan    PT Assessment Patient needs continued PT services  PT Problem List Decreased strength;Decreased mobility;Decreased balance;Decreased range of motion;Decreased activity tolerance;Decreased knowledge of use of DME       PT Treatment Interventions DME instruction;Functional mobility training;Balance training;Patient/family education;Modalities;Therapeutic activities;Gait training;Stair training;Therapeutic exercise    PT Goals (Current goals can be found in the Care Plan section)  Acute Rehab PT Goals Patient Stated Goal: get back to riding horses PT Goal Formulation: With patient Time For Goal Achievement: 02/07/19 Potential to Achieve Goals: Good    Frequency 7X/week    AM-PAC PT "6 Clicks" Mobility  Outcome Measure Help needed turning from your back to your side while in a flat bed without using bedrails?: A Little Help needed moving from lying on your back to sitting on the side of a flat bed without using bedrails?: A Little Help needed moving to and from a bed to a chair (including a wheelchair)?: A Little Help needed standing up from a chair using your arms (e.g., wheelchair or bedside chair)?: A Little Help needed to walk in hospital room?: A Little Help needed climbing 3-5 steps with a railing? : A Little 6 Click Score: 18    End  of Session Equipment Utilized During Treatment: Gait belt Activity Tolerance: Patient tolerated treatment well Patient left: with call bell/phone within reach;in chair;with family/visitor present;with chair alarm set Nurse Communication: Mobility status PT Visit Diagnosis: Muscle weakness (generalized) (M62.81);Difficulty in walking, not elsewhere classified (R26.2)    Time: 3299-2426 PT Time Calculation (min) (ACUTE ONLY): 29 min   Charges:   PT Evaluation $PT Eval Low Complexity: 1 Low PT Treatments $Therapeutic Exercise: 8-22 mins        Kipp Brood, PT, DPT Physical Therapist with [facility]  01/31/2019 8:01 PM

## 2019-01-31 NOTE — Discharge Instructions (Signed)

## 2019-01-31 NOTE — Anesthesia Procedure Notes (Signed)
Anesthesia Regional Block: Adductor canal block   Pre-Anesthetic Checklist: ,, timeout performed, Correct Patient, Correct Site, Correct Laterality, Correct Procedure, Correct Position, site marked, Risks and benefits discussed,  Surgical consent,  Pre-op evaluation,  At surgeon's request and post-op pain management  Laterality: Right  Prep: chloraprep       Needles:   Needle Type: Echogenic Needle     Needle Length: 9cm  Needle Gauge: 21     Additional Needles:   Procedures:,,,, ultrasound used (permanent image in chart),,,,  Narrative:  Start time: 01/31/2019 8:08 AM End time: 01/31/2019 8:15 AM Injection made incrementally with aspirations every 5 mL.  Performed by: Personally  Anesthesiologist: Annye Asa, MD  Additional Notes: Pt identified in Holding room.  Monitors applied. Working IV access confirmed. Sterile prep R thigh.  #21ga ECHOgenic needle into adductor canal with US guidance.  20cc 0.75% Ropivacaine injected incrementally after negative test dose.  Patient asymptomatic, VSS, no heme aspirated, tolerated well.  Jenita Seashore, MD

## 2019-01-31 NOTE — Progress Notes (Signed)
AssistedDr. Carswell Jackson with right, ultrasound guided, adductor canal block. Side rails up, monitors on throughout procedure. See vital signs in flow sheet. Tolerated Procedure well.  

## 2019-01-31 NOTE — Interval H&P Note (Signed)
History and Physical Interval Note:  01/31/2019 7:18 AM  Melissa Collier  has presented today for surgery, with the diagnosis of Right knee osteoarthritis.  The various methods of treatment have been discussed with the patient and family. After consideration of risks, benefits and other options for treatment, the patient has consented to  Procedure(s): TOTAL KNEE ARTHROPLASTY (Right) as a surgical intervention.  The patient's history has been reviewed, patient examined, no change in status, stable for surgery.  I have reviewed the patient's chart and labs.  Questions were answered to the patient's satisfaction.     [name]

## 2019-01-31 NOTE — Transfer of Care (Signed)
Immediate Anesthesia Transfer of Care Note  Patient: Suzette A Reynoso  Procedure(s) Performed: TOTAL KNEE ARTHROPLASTY (Right Knee)  Patient Location: PACU  Anesthesia Type:Spinal and MAC combined with regional for post-op pain  Level of Consciousness: awake, alert , oriented and patient cooperative  Airway & Oxygen Therapy: Patient Spontanous Breathing and Patient connected to face mask oxygen  Post-op Assessment: Report given to RN and Post -op Vital signs reviewed and stable  Post vital signs: Reviewed and stable  Last Vitals:  Vitals Value Taken Time  BP    Temp    Pulse 72 01/31/19 1024  Resp 19 01/31/19 1024  SpO2 100 % 01/31/19 1024  Vitals shown include unvalidated device data.  Last Pain:  Vitals:   01/31/19 0657  TempSrc: Oral         Complications: No apparent anesthesia complications

## 2019-01-31 NOTE — Op Note (Signed)
NAME:  Melissa Collier                      MEDICAL RECORD NO.:  [id number]                             FACILITY:  [facility]      PHYSICIAN:  Pietro Cassis. Alvan Dame, M.D.  DATE OF BIRTH:  1950/03/21      DATE OF PROCEDURE:  01/31/2019                                     OPERATIVE REPORT         PREOPERATIVE DIAGNOSIS:  Right knee osteoarthritis.      POSTOPERATIVE DIAGNOSIS:  Right knee osteoarthritis.      FINDINGS:  The patient was noted to have complete loss of cartilage and   bone-on-bone arthritis with associated osteophytes in the lateral and patellofemoral compartments of   the knee with a significant synovitis and associated effusion.  The patient had failed months of conservative treatment including medications, injection therapy, activity modification.     PROCEDURE:  Right total knee replacement.      COMPONENTS USED:  DePuy Attune rotating platform posterior stabilized knee   system, a size 5N femur, 4 tibia, size 7 mm PS AOX insert, and 35 anatomic patellar   button.      SURGEON:  Pietro Cassis. Alvan Dame, M.D.      ASSISTANT:  Danae Orleans, PA-C.      ANESTHESIA:  Regional and Spinal.      SPECIMENS:  None.      COMPLICATION:  None.      DRAINS:  None.  EBL: <100cc      TOURNIQUET TIME:   Total Tourniquet Time Documented: Thigh (Right) - 26 minutes Total: Thigh (Right) - 26 minutes      The patient was stable to the recovery room.      INDICATION FOR PROCEDURE:  Melissa Collier is a 68 y.o. female patient of   mine.  The patient had been seen, evaluated, and treated for months conservatively in the   office with medication, activity modification, and injections.  The patient had   radiographic changes of bone-on-bone arthritis with endplate sclerosis and osteophytes noted.  Based on the radiographic changes and failed conservative measures, the patient   decided to proceed with definitive treatment, total knee replacement.  Risks of infection, DVT, component  failure, need for revision surgery, neurovascular injury were reviewed in the office setting.  The postop course was reviewed stressing the efforts to maximize post-operative satisfaction and function.  Consent was obtained for benefit of pain   relief.      PROCEDURE IN DETAIL:  The patient was brought to the operative theater.   Once adequate anesthesia, preoperative antibiotics, 2 gm of Ancef, 1 gm of Vancomycin (MRSA positive screen),1 gm of Tranexamic Acid, and 10 mg of Decadron administered, the patient was positioned supine with a right thigh tourniquet placed.  The  right lower extremity was prepped and draped in sterile fashion.  A time-   out was performed identifying the patient, planned procedure, and the appropriate extremity.      The right lower extremity was placed in the [facility] leg holder.  The leg was   exsanguinated, tourniquet elevated to 250 mmHg.  A midline  incision was   made followed by median parapatellar arthrotomy.  Following initial   exposure, attention was first directed to the patella.  Precut   measurement was noted to be 21 mm.  I resected down to 13 mm and used a   35 anatomic patellar button to restore patellar height as well as cover the cut surface.      The lug holes were drilled and a metal shim was placed to protect the   patella from retractors and saw blade during the procedure.      At this point, attention was now directed to the femur.  The femoral   canal was opened with a drill, irrigated to try to prevent fat emboli.  An   intramedullary rod was passed at 3 degrees valgus, 9 mm of bone was   resected off the distal femur.  Following this resection, the tibia was   subluxated anteriorly.  Using the extramedullary guide, 2 mm of bone was resected off   the proximallateral tibia.  We confirmed the gap would be   stable medially and laterally with a size 5 spacer block as well as confirmed that the tibial cut was perpendicular in the coronal plane,  checking with an alignment rod.      Once this was done, I sized the femur to be a size 5 in the anterior-   posterior dimension, chose a narrow component based on medial and   lateral dimension.  The size 5 rotation block was then pinned in   position anterior referenced using the C-clamp to set rotation.  The   anterior, posterior, and  chamfer cuts were made without difficulty nor   notching making certain that I was along the anterior cortex to help   with flexion gap stability.      The final box cut was made off the lateral aspect of distal femur.      At this point, the tibia was sized to be a size 4.  The size 4 tray was   then pinned in position through the medial third of the tubercle,   drilled, and keel punched.  Trial reduction was now carried with a 5 femur,  4 tibia, a size 7 mm PS insert, and the 35 anatomic patella botton.  The knee was brought to full extension with good flexion stability with the patella   tracking through the trochlea without application of pressure.  Given   all these findings the trial components removed.  Final components were   opened and cement was mixed.  The knee was irrigated with normal saline solution and pulse lavage.  The synovial lining was   then injected with 30 cc of 0.25% Marcaine with epinephrine, 1 cc of Toradol and 30 cc of NS for a total of 61 cc.     Final implants were then cemented onto cleaned and dried cut surfaces of bone with the knee brought to extension with a size 7 mm PS trial insert.      Once the cement had fully cured, excess cement was removed   throughout the knee.  I confirmed that I was satisfied with the range of   motion and stability, and the final size 7 mm PS AOX insert was chosen.  It was   placed into the knee.      The tourniquet had been let down at 26 minutes.  No significant   hemostasis was required.  The extensor mechanism was then reapproximated  using #1 Vicryl and #1 Stratafix sutures with the knee    in flexion.  The   remaining wound was closed with 2-0 Vicryl and running 4-0 Monocryl.   The knee was cleaned, dried, dressed sterilely using Dermabond and   Aquacel dressing.  The patient was then   brought to recovery room in stable condition, tolerating the procedure   well.   Please note that Physician Assistant, Dennie BibleAshley Stinson, PA-C was present for the entirety of the case, and was utilized for pre-operative positioning, peri-operative retractor management, general facilitation of the procedure and for primary wound closure at the end of the case.              Madlyn FrankelMatthew D. Charlann Boxerlin, M.D.    01/31/2019 9:58 AM

## 2019-01-31 NOTE — Anesthesia Postprocedure Evaluation (Signed)
Anesthesia Post Note  Patient: [name]  Procedure(s) Performed: TOTAL KNEE ARTHROPLASTY (Right Knee)     Patient location during evaluation: PACU Anesthesia Type: Spinal Level of consciousness: awake and alert, patient cooperative and oriented Pain management: pain level controlled Vital Signs Assessment: post-procedure vital signs reviewed and stable Respiratory status: spontaneous breathing, nonlabored ventilation and respiratory function stable Cardiovascular status: blood pressure returned to baseline and stable Postop Assessment: spinal receding and no apparent nausea or vomiting Anesthetic complications: no    Last Vitals:  Vitals:   01/31/19 1200 01/31/19 1218  BP: 138/85 (!) 143/86  Pulse: 72 67  Resp: 14   Temp: 36.5 C   SpO2: 99% 100%    Last Pain:  Vitals:   01/31/19 1200  TempSrc:   PainSc: 0-No pain                 Hinton Luellen,E. Kalim Kissel

## 2019-01-31 NOTE — Anesthesia Procedure Notes (Signed)
Spinal  Patient location during procedure: OR End time: 01/31/2019 8:48 AM Staffing Anesthesiologist: Annye Asa, MD Performed: anesthesiologist  Preanesthetic Checklist Completed: patient identified, site marked, surgical consent, pre-op evaluation, timeout performed, IV checked, risks and benefits discussed and monitors and equipment checked Spinal Block Patient position: sitting Prep: site prepped and draped and DuraPrep Patient monitoring: blood pressure, continuous pulse ox, cardiac monitor and heart rate Approach: midline Location: L3-4 Injection technique: single-shot Needle Needle type: Pencan  Needle gauge: 24 G Needle length: 9 cm Additional Notes Pt identified in Operating room.  Monitors applied. Working IV access confirmed. Sterile prep, drape lumbar spine.  1% lido local L 3,4.  (CRNA unsuccessful/os), #24ga Quincke into clear CSF L 3,4.  12mg  0.75% Bupivacaine with dextrose injected with asp CSF beginning and end of injection.  Patient asymptomatic, VSS, no heme aspirated, tolerated well.  Jenita Seashore, MD

## 2019-02-01 ENCOUNTER — Encounter (HOSPITAL_COMMUNITY): Payer: Self-pay | Admitting: Orthopedic Surgery

## 2019-02-01 DIAGNOSIS — M25761 Osteophyte, right knee: Secondary | ICD-10-CM | POA: Diagnosis not present

## 2019-02-01 DIAGNOSIS — M1711 Unilateral primary osteoarthritis, right knee: Secondary | ICD-10-CM | POA: Diagnosis not present

## 2019-02-01 DIAGNOSIS — E063 Autoimmune thyroiditis: Secondary | ICD-10-CM | POA: Diagnosis not present

## 2019-02-01 DIAGNOSIS — M25461 Effusion, right knee: Secondary | ICD-10-CM | POA: Diagnosis not present

## 2019-02-01 DIAGNOSIS — E785 Hyperlipidemia, unspecified: Secondary | ICD-10-CM | POA: Diagnosis not present

## 2019-02-01 DIAGNOSIS — E663 Overweight: Secondary | ICD-10-CM | POA: Diagnosis present

## 2019-02-01 DIAGNOSIS — M659 Synovitis and tenosynovitis, unspecified: Secondary | ICD-10-CM | POA: Diagnosis not present

## 2019-02-01 DIAGNOSIS — G4730 Sleep apnea, unspecified: Secondary | ICD-10-CM | POA: Diagnosis not present

## 2019-02-01 DIAGNOSIS — I10 Essential (primary) hypertension: Secondary | ICD-10-CM | POA: Diagnosis not present

## 2019-02-01 DIAGNOSIS — J449 Chronic obstructive pulmonary disease, unspecified: Secondary | ICD-10-CM | POA: Diagnosis not present

## 2019-02-01 LAB — BASIC METABOLIC PANEL
Anion gap: 10 (ref 5–15)
BUN: 17 mg/dL (ref 8–23)
CO2: 21 mmol/L — ABNORMAL LOW (ref 22–32)
Calcium: 8.8 mg/dL — ABNORMAL LOW (ref 8.9–10.3)
Chloride: 105 mmol/L (ref 98–111)
Creatinine, Ser: 0.77 mg/dL (ref 0.44–1.00)
GFR calc Af Amer: >60 mL/min (ref >60)
GFR calc non Af Amer: >60 mL/min (ref >60)
Glucose, Bld: 143 mg/dL — ABNORMAL HIGH (ref 70–99)
Potassium: 3.8 mmol/L (ref 3.5–5.1)
Sodium: 136 mmol/L (ref 135–145)

## 2019-02-01 LAB — CBC
HCT: 35.8 % — ABNORMAL LOW (ref 36.0–46.0)
Hemoglobin: 11.8 g/dL — ABNORMAL LOW (ref 12.0–15.0)
MCH: 31.8 pg (ref 26.0–34.0)
MCHC: 33 g/dL (ref 30.0–36.0)
MCV: 96.5 fL (ref 80.0–100.0)
Platelets: 199 10*3/uL (ref 150–400)
RBC: 3.71 MIL/uL — ABNORMAL LOW (ref 3.87–5.11)
RDW: 12.3 % (ref 11.5–15.5)
WBC: 10.8 10*3/uL — ABNORMAL HIGH (ref 4.0–10.5)
nRBC: 0 % (ref 0.0–0.2)

## 2019-02-01 MED ORDER — HYDROCODONE-ACETAMINOPHEN 7.5-325 MG PO TABS: 1.0000 | ORAL_TABLET | ORAL | 0 refills | Status: DC | PRN

## 2019-02-01 MED ORDER — DOCUSATE SODIUM 100 MG PO CAPS: 100.0000 mg | ORAL_CAPSULE | Freq: Two times a day (BID) | ORAL | 0 refills | Status: DC

## 2019-02-01 MED ORDER — POLYETHYLENE GLYCOL 3350 17 G PO PACK: 17.0000 g | PACK | Freq: Two times a day (BID) | ORAL | 0 refills | Status: DC

## 2019-02-01 MED ORDER — ASPIRIN 81 MG PO CHEW: 81.0000 mg | CHEWABLE_TABLET | Freq: Two times a day (BID) | ORAL | 0 refills | Status: AC

## 2019-02-01 MED ORDER — FERROUS SULFATE 325 (65 FE) MG PO TABS: 325.0000 mg | ORAL_TABLET | Freq: Three times a day (TID) | ORAL | 0 refills | Status: DC

## 2019-02-01 MED ORDER — METHOCARBAMOL 500 MG PO TABS: 500.0000 mg | ORAL_TABLET | Freq: Four times a day (QID) | ORAL | 0 refills | Status: DC | PRN

## 2019-02-01 NOTE — Progress Notes (Signed)
Physical Therapy Treatment Patient Details Name: Melissa Collier MRN: [id number] DOB: 07/21/1950 Today's Date: 02/01/2019    History of Present Illness Patient is 68 y.o. female s/p Rt TKA on 01/31/19 with PMH significant for osteopenia, HTN, HLD, hypothyroidism, GERD, OA, asthma.    PT Comments    Pt with improved tolerance for ambulation today, and progressed to step-through gait. Pt demonstrates proficiency in both stair navigation and TKR exercises, handout administered, reviewed, and practiced with pt. Pt with no further questions, pt safe to d/c home from PT perspective.    Follow Up Recommendations  Follow surgeon's recommendation for DC plan and follow-up therapies     Equipment Recommendations  Rolling walker with 5" wheels;3in1 (PT)    Recommendations for Other Services       Precautions / Restrictions Precautions Precautions: Fall Restrictions Weight Bearing Restrictions: No    Mobility  Bed Mobility Overal bed mobility: Needs Assistance Bed Mobility: Supine to Sit     Supine to sit: HOB elevated;Supervision     General bed mobility comments: supervision for safety, increased time but no physical assist required.  Transfers Overall transfer level: Needs assistance Equipment used: Rolling walker (2 wheeled) Transfers: Sit to/from Stand Sit to Stand: Supervision         General transfer comment: supervision for safety, pt able to state and correctly perform hand placement when rising.  Ambulation/Gait Ambulation/Gait assistance: Supervision Gait Distance (Feet): 100 Feet(2x100 ft) Assistive device: Rolling walker (2 wheeled) Gait Pattern/deviations: Decreased stride length;Narrow base of support;Step-through pattern;Decreased stance time - right;Decreased weight shift to right Gait velocity: slightly decr   General Gait Details: Supervision for safety, verbal cuing for sequencing with step-through gait, placement in RW.   Stairs Stairs:  Yes Stairs assistance: Min guard Stair Management: No rails;Step to pattern;Forwards;With walker Number of Stairs: 3 General stair comments: Min guard for safety, verbal cuing for sequencing (ascending with LLE leading, descending with RLE leading) and bracing RW from caregiver perspective and bracing RW against steps during ascending/descending. Pt practiced stairs x2 to ensure proper form.   Wheelchair Mobility    Modified Rankin (Stroke Patients Only)       Balance Overall balance assessment: Needs assistance Sitting-balance support: No upper extremity supported;Feet supported Sitting balance-Leahy Scale: Good     Standing balance support: During functional activity;Bilateral upper extremity supported Standing balance-Leahy Scale: Poor Standing balance comment: reliant on external support in dynamic standing                            Cognition Arousal/Alertness: Awake/alert Behavior During Therapy: WFL for tasks assessed/performed Overall Cognitive Status: Within Functional Limits for tasks assessed                                        Exercises Total Joint Exercises Ankle Circles/Pumps: AROM;10 reps;Seated;Both Quad Sets: AROM;10 reps;Seated;Right Short Arc Quad: AROM;Right;10 reps;Seated Heel Slides: AAROM;10 reps;Seated;Right Hip ABduction/ADduction: AROM;Right;10 reps;Seated Straight Leg Raises: AROM;Right;10 reps;Seated Long Arc Quad: AROM;Right;5 reps;Seated Knee Flexion: AROM;Right;10 reps;Seated(with towel under foot to assist in sliding) Goniometric ROM: knee aarom 5-90*    General Comments        Pertinent Vitals/Pain Pain Assessment: 0-10 Pain Score: 3  Pain Location: Rt knee Pain Descriptors / Indicators: Sore Pain Intervention(s): Limited activity within patient's tolerance;Monitored during session;Premedicated before session;Repositioned    Home Living  Prior Function             PT Goals (current goals can now be found in the care plan section) Acute Rehab PT Goals Patient Stated Goal: get back to riding horses PT Goal Formulation: With patient Time For Goal Achievement: 02/07/19 Potential to Achieve Goals: Good Progress towards PT goals: Progressing toward goals    Frequency    7X/week      PT Plan Current plan remains appropriate    Co-evaluation              AM-PAC PT "6 Clicks" Mobility   Outcome Measure  Help needed turning from your back to your side while in a flat bed without using bedrails?: None Help needed moving from lying on your back to sitting on the side of a flat bed without using bedrails?: None Help needed moving to and from a bed to a chair (including a wheelchair)?: None Help needed standing up from a chair using your arms (e.g., wheelchair or bedside chair)?: None Help needed to walk in hospital room?: A Little Help needed climbing 3-5 steps with a railing? : A Little 6 Click Score: 22    End of Session Equipment Utilized During Treatment: Gait belt Activity Tolerance: Patient tolerated treatment well Patient left: with call bell/phone within reach;in chair;with family/visitor present Nurse Communication: Mobility status PT Visit Diagnosis: Muscle weakness (generalized) (M62.81);Difficulty in walking, not elsewhere classified (R26.2)     Time: 0931-1216 PT Time Calculation (min) (ACUTE ONLY): 35 min  Charges:  $Gait Training: 8-22 mins $Therapeutic Exercise: 8-22 mins                     Rhilynn Preyer E, PT Acute Rehabilitation Services Pager [phone]  Office [phone]   Montserrat Shek D Zander Ingham 02/01/2019, 12:22 PM

## 2019-02-01 NOTE — Plan of Care (Signed)
°  Problem: Education: °Goal: Knowledge of the prescribed therapeutic regimen will improve °Outcome: Progressing °Goal: Individualized Educational Video(s) °Outcome: Progressing °  °Problem: Activity: °Goal: Ability to avoid complications of mobility impairment will improve °Outcome: Progressing °Goal: Range of joint motion will improve °Outcome: Progressing °  °Problem: Clinical Measurements: °Goal: Postoperative complications will be avoided or minimized °Outcome: Progressing °  °

## 2019-02-01 NOTE — Plan of Care (Signed)
  Problem: Education: Goal: Knowledge of the prescribed therapeutic regimen will improve 02/01/2019 1312 by Hubert Azure, RN Outcome: Adequate for Discharge 02/01/2019 1253 by Hubert Azure, RN Outcome: Adequate for Discharge 02/01/2019 8675 by Hubert Azure, RN Outcome: Progressing Goal: Individualized Educational Video(s) 02/01/2019 1312 by Hubert Azure, RN Outcome: Adequate for Discharge 02/01/2019 1253 by Hubert Azure, RN Outcome: Adequate for Discharge 02/01/2019 0852 by Hubert Azure, RN Outcome: Progressing   Problem: Activity: Goal: Ability to avoid complications of mobility impairment will improve 02/01/2019 1312 by Hubert Azure, RN Outcome: Adequate for Discharge 02/01/2019 1253 by Hubert Azure, RN Outcome: Adequate for Discharge 02/01/2019 4492 by Hubert Azure, RN Outcome: Progressing Goal: Range of joint motion will improve 02/01/2019 1312 by Hubert Azure, RN Outcome: Adequate for Discharge 02/01/2019 1253 by Hubert Azure, RN Outcome: Adequate for Discharge 02/01/2019 0100 by Hubert Azure, RN Outcome: Progressing   Problem: Clinical Measurements: Goal: Postoperative complications will be avoided or minimized 02/01/2019 1312 by Hubert Azure, RN Outcome: Adequate for Discharge 02/01/2019 1253 by Hubert Azure, RN Outcome: Adequate for Discharge 02/01/2019 0852 by Hubert Azure, RN Outcome: Progressing   Problem: Pain Management: Goal: Pain level will decrease with appropriate interventions 02/01/2019 1312 by Hubert Azure, RN Outcome: Adequate for Discharge 02/01/2019 1253 by Hubert Azure, RN Outcome: Adequate for Discharge   Problem: Skin Integrity: Goal: Will show signs of wound healing 02/01/2019 1312 by Hubert Azure, RN Outcome: Adequate for Discharge 02/01/2019 1253 by Hubert Azure, RN Outcome: Adequate for Discharge

## 2019-02-01 NOTE — Progress Notes (Signed)
     Subjective: 1 Day Post-Op Procedure(s) (LRB): TOTAL KNEE ARTHROPLASTY (Right)   Patient reports pain as mild, pain controlled. No events throughout the night. Dr. Alvan Dame discussed the procedure, findings and expectations moving forward.  Patient will be discharged home, if they do well with PT.  They will follow up in the clinic in 2 weeks. They know to call with any questions or concerns.    Patient's anticipated LOS is less than 2 midnights, meeting these requirements: - Lives within 1 hour of care - Has a competent adult at home to recover with post-op recover - NO history of  - Chronic pain requiring opiods  - Diabetes  - Coronary Artery Disease  - Heart failure  - Heart attack  - Stroke  - DVT/VTE  - Cardiac arrhythmia  - Respiratory Failure/COPD  - Renal failure  - Anemia  - Advanced Liver disease    Objective:   VITALS:   Vitals:   02/01/19 0140 02/01/19 0604  BP: 134/79 134/73  Pulse: 61 (!) 57  Resp: 16 16  Temp: 97.8 F (36.6 C) 97.6 F (36.4 C)  SpO2: 97% 97%    Dorsiflexion/Plantar flexion intact Incision: dressing C/D/I No cellulitis present Compartment soft  LABS Recent Labs    02/01/19 0242  HGB 11.8*  HCT 35.8*  WBC 10.8*  PLT 199    Recent Labs    02/01/19 0242  NA 136  K 3.8  BUN 17  CREATININE 0.77  GLUCOSE 143*     Assessment/Plan: 1 Day Post-Op Procedure(s) (LRB): TOTAL KNEE ARTHROPLASTY (Right) Foley cath d/c'ed Advance diet Up with therapy D/C IV fluids Discharge home Follow up in 2 weeks at [facility] Follow up with OLIN,Ruger Saxer D in 2 weeks.  Contact information:  [facility] [address], [address] [place] [address] [phone]    Overweight (BMI 25-29.9) Estimated body mass index is 25.43 kg/m as calculated from the following:   Height as of 01/25/19: 5' 5.5" (1.664 m).   Weight as of 01/25/19: 70.4 kg. Patient also counseled that weight may inhibit the healing process  Patient counseled that losing weight will help with future health issues      West Pugh. Merrel Crabbe   PAC  02/01/2019, 8:27 AM

## 2019-02-01 NOTE — Plan of Care (Signed)
  Problem: Education: Goal: Knowledge of the prescribed therapeutic regimen will improve 02/01/2019 1253 by Hubert Azure, RN Outcome: Adequate for Discharge 02/01/2019 8921 by Hubert Azure, RN Outcome: Progressing Goal: Individualized Educational Video(s) 02/01/2019 1253 by Hubert Azure, RN Outcome: Adequate for Discharge 02/01/2019 0852 by Hubert Azure, RN Outcome: Progressing   Problem: Activity: Goal: Ability to avoid complications of mobility impairment will improve 02/01/2019 1253 by Hubert Azure, RN Outcome: Adequate for Discharge 02/01/2019 1941 by Hubert Azure, RN Outcome: Progressing Goal: Range of joint motion will improve 02/01/2019 1253 by Hubert Azure, RN Outcome: Adequate for Discharge 02/01/2019 7408 by Hubert Azure, RN Outcome: Progressing   Problem: Clinical Measurements: Goal: Postoperative complications will be avoided or minimized 02/01/2019 1253 by Hubert Azure, RN Outcome: Adequate for Discharge 02/01/2019 0852 by Hubert Azure, RN Outcome: Progressing   Problem: Pain Management: Goal: Pain level will decrease with appropriate interventions Outcome: Adequate for Discharge   Problem: Skin Integrity: Goal: Will show signs of wound healing Outcome: Adequate for Discharge

## 2019-02-07 NOTE — Discharge Summary (Signed)
Physician Discharge Summary  Patient ID: Melissa Collier MRN: [id number] DOB/AGE: 69/10/1950 69 y.o.  Admit date: 01/31/2019 Discharge date: 02/01/2019   Procedures:  Procedure(s) (LRB): TOTAL KNEE ARTHROPLASTY (Right)  Attending Physician:  Dr. Durene RomansMatthew Olin   Admission Diagnoses:   Right knee primary OA / pain  Discharge Diagnoses:  Principal Problem:   S/P right TKA Active Problems:   Overweight (BMI 25.0-29.9)  Past Medical History:  Diagnosis Date   Arthritis    Asthma    GERD (gastroesophageal reflux disease)    Hashimoto's thyroiditis    History of hiatal hernia    Hyperlipidemia    Hypertension    Hypothyroidism    Multinodular goiter    Osteopenia    Pneumonia    History of   Sciatica    Left greater than right   Sleep apnea    Mild, NO CPAP    HPI:    Melissa Collier, 68 y.o. female, has a history of pain and functional disability in the right knee due to arthritis and has failed non-surgical conservative treatments for greater than 12 weeks to include NSAID's and/or analgesics, corticosteriod injections, viscosupplementation injections and activity modification.  Onset of symptoms was gradual, starting 10 years ago with gradually worsening course since that time. The patient noted no past surgery on the right knee(s).  Patient currently rates pain in the right knee(s) at 5 out of 10 with activity. Patient has night pain, worsening of pain with activity and weight bearing, pain that interferes with activities of daily living, pain with passive range of motion, crepitus and joint swelling.  Patient has evidence of periarticular osteophytes and joint space narrowing by imaging studies.  There is no active infection.  Risks, benefits and expectations were discussed with the patient.  Risks including but not limited to the risk of anesthesia, blood clots, nerve damage, blood vessel damage, failure of the prosthesis, infection and up to and  including death.  Patient understand the risks, benefits and expectations and wishes to proceed with surgery.  PCP: Darrow BussingKoirala, Dibas, MD   Discharged Condition: good  Hospital Course:  Patient underwent the above stated procedure on 01/31/2019. Patient tolerated the procedure well and brought to the recovery room in good condition and subsequently to the floor.  POD #1 BP: 134/73 ; Pulse: 57 ; Temp: 97.6 F (36.4 C) ; Resp: 16 Patient reports pain as mild, pain controlled. No events throughout the night. Dr. Charlann Boxerlin discussed the procedure, findings and expectations moving forward.  Patient will be discharged home. Dorsiflexion/plantar flexion intact, incision: dressing C/D/I, no cellulitis present and compartment soft.   LABS  Basename    HGB     11.8  HCT     35.8     Discharge Exam: General appearance: alert, cooperative and no distress Extremities: Homans sign is negative, no sign of DVT, no edema, redness or tenderness in the calves or thighs and no ulcers, gangrene or trophic changes  Disposition:  Home with follow up in 2 weeks   Follow-up Information    Durene Romanslin, Karimah Winquist, MD. Schedule an appointment as soon as possible for a visit in 2 weeks.   Specialty: Orthopedic Surgery Contact information: [address] [place] [address] [place] [address] [phone]           Discharge Instructions    Call MD / Call 911   Complete by: As directed    If you experience chest pain or shortness of breath, CALL 911 and be  transported to the hospital emergency room.  If you develope a fever above 101 F, pus (white drainage) or increased drainage or redness at the wound, or calf pain, call your surgeon's office.   Change dressing   Complete by: As directed    Maintain surgical dressing until follow up in the clinic. If the edges start to pull up, may reinforce with tape. If the dressing is no longer working, may remove and cover with gauze and tape, but must keep the area dry and clean.   Call with any questions or concerns.   Constipation Prevention   Complete by: As directed    Drink plenty of fluids.  Prune juice may be helpful.  You may use a stool softener, such as Colace (over the counter) 100 mg twice a day.  Use MiraLax (over the counter) for constipation as needed.   Diet - low sodium heart healthy   Complete by: As directed    Discharge instructions   Complete by: As directed    Maintain surgical dressing until follow up in the clinic. If the edges start to pull up, may reinforce with tape. If the dressing is no longer working, may remove and cover with gauze and tape, but must keep the area dry and clean.  Follow up in 2 weeks at [facility]. Call with any questions or concerns.   Increase activity slowly as tolerated   Complete by: As directed    Weight bearing as tolerated with assist device (walker, cane, etc) as directed, use it as long as suggested by your surgeon or therapist, typically at least 4-6 weeks.   TED hose   Complete by: As directed    Use stockings (TED hose) for 2 weeks on both leg(s).  You may remove them at night for sleeping.      Allergies as of 02/01/2019      Reactions   Dust Mite Extract Itching, Nausea And Vomiting   Cat /  causes runny eyes      Medication List    STOP taking these medications   meloxicam 15 MG tablet Commonly known as: MOBIC     TAKE these medications   albuterol 108 (90 Base) MCG/ACT inhaler Commonly known as: VENTOLIN HFA Inhale 2 puffs into the lungs every 4 (four) hours as needed.   amLODipine 2.5 MG tablet Commonly known as: NORVASC Take 2.5 mg by mouth daily.   aspirin 81 MG chewable tablet Commonly known as: Aspirin Childrens Chew 1 tablet (81 mg total) by mouth 2 (two) times daily. Take for 4 weeks, then resume regular dose.   atorvastatin 20 MG tablet Commonly known as: LIPITOR Take 20 mg by mouth at bedtime.   Calcium 600 600 MG Tabs tablet Generic drug: calcium  carbonate Take 600 mg by mouth daily with breakfast.   docusate sodium 100 MG capsule Commonly known as: Colace Take 1 capsule (100 mg total) by mouth 2 (two) times daily.   ferrous sulfate 325 (65 FE) MG tablet Commonly known as: FerrouSul Take 1 tablet (325 mg total) by mouth 3 (three) times daily with meals for 14 days.   HYDROcodone-acetaminophen 7.5-325 MG tablet Commonly known as: Norco Take 1-2 tablets by mouth every 4 (four) hours as needed for moderate pain.   levothyroxine 75 MCG tablet Commonly known as: Synthroid Take 1 tablet (75 mcg total) by mouth daily. What changed: how much to take   methocarbamol 500 MG tablet Commonly known as: Robaxin Take 1 tablet (500  mg total) by mouth every 6 (six) hours as needed for muscle spasms.   omeprazole 40 MG capsule Commonly known as: PRILOSEC Take 40 mg by mouth at bedtime.   polyethylene glycol 17 g packet Commonly known as: MIRALAX / GLYCOLAX Take 17 g by mouth 2 (two) times daily.   valsartan-hydrochlorothiazide 320-25 MG tablet Commonly known as: DIOVAN-HCT Take 1 tablet by mouth daily.            Discharge Care Instructions  (From admission, onward)         Start     Ordered   02/01/19 0000  Change dressing    Comments: Maintain surgical dressing until follow up in the clinic. If the edges start to pull up, may reinforce with tape. If the dressing is no longer working, may remove and cover with gauze and tape, but must keep the area dry and clean.  Call with any questions or concerns.   02/01/19 1062           Signed: Anastasio Auerbach. Thresia Ramanathan   PA-C  02/07/2019, 8:15 AM

## 2019-03-06 DIAGNOSIS — R2689 Other abnormalities of gait and mobility: Secondary | ICD-10-CM | POA: Diagnosis not present

## 2019-03-06 DIAGNOSIS — M25461 Effusion, right knee: Secondary | ICD-10-CM | POA: Diagnosis not present

## 2019-03-06 DIAGNOSIS — M25661 Stiffness of right knee, not elsewhere classified: Secondary | ICD-10-CM | POA: Diagnosis not present

## 2019-03-06 DIAGNOSIS — M25561 Pain in right knee: Secondary | ICD-10-CM | POA: Diagnosis not present

## 2019-03-08 DIAGNOSIS — M25561 Pain in right knee: Secondary | ICD-10-CM | POA: Diagnosis not present

## 2019-03-08 DIAGNOSIS — M25661 Stiffness of right knee, not elsewhere classified: Secondary | ICD-10-CM | POA: Diagnosis not present

## 2019-03-08 DIAGNOSIS — R2689 Other abnormalities of gait and mobility: Secondary | ICD-10-CM | POA: Diagnosis not present

## 2019-03-08 DIAGNOSIS — M25461 Effusion, right knee: Secondary | ICD-10-CM | POA: Diagnosis not present

## 2019-03-15 DIAGNOSIS — M25561 Pain in right knee: Secondary | ICD-10-CM | POA: Diagnosis not present

## 2019-03-15 DIAGNOSIS — R2689 Other abnormalities of gait and mobility: Secondary | ICD-10-CM | POA: Diagnosis not present

## 2019-03-15 DIAGNOSIS — M25661 Stiffness of right knee, not elsewhere classified: Secondary | ICD-10-CM | POA: Diagnosis not present

## 2019-03-15 DIAGNOSIS — M25461 Effusion, right knee: Secondary | ICD-10-CM | POA: Diagnosis not present

## 2019-03-17 DIAGNOSIS — M25661 Stiffness of right knee, not elsewhere classified: Secondary | ICD-10-CM | POA: Diagnosis not present

## 2019-03-17 DIAGNOSIS — M25561 Pain in right knee: Secondary | ICD-10-CM | POA: Diagnosis not present

## 2019-03-17 DIAGNOSIS — M25461 Effusion, right knee: Secondary | ICD-10-CM | POA: Diagnosis not present

## 2019-03-17 DIAGNOSIS — R2689 Other abnormalities of gait and mobility: Secondary | ICD-10-CM | POA: Diagnosis not present

## 2019-03-21 DIAGNOSIS — M25561 Pain in right knee: Secondary | ICD-10-CM | POA: Diagnosis not present

## 2019-03-21 DIAGNOSIS — M25661 Stiffness of right knee, not elsewhere classified: Secondary | ICD-10-CM | POA: Diagnosis not present

## 2019-03-21 DIAGNOSIS — M25461 Effusion, right knee: Secondary | ICD-10-CM | POA: Diagnosis not present

## 2019-03-21 DIAGNOSIS — R2689 Other abnormalities of gait and mobility: Secondary | ICD-10-CM | POA: Diagnosis not present

## 2019-03-28 DIAGNOSIS — M25661 Stiffness of right knee, not elsewhere classified: Secondary | ICD-10-CM | POA: Diagnosis not present

## 2019-03-28 DIAGNOSIS — M25561 Pain in right knee: Secondary | ICD-10-CM | POA: Diagnosis not present

## 2019-03-28 DIAGNOSIS — M25461 Effusion, right knee: Secondary | ICD-10-CM | POA: Diagnosis not present

## 2019-03-28 DIAGNOSIS — R2689 Other abnormalities of gait and mobility: Secondary | ICD-10-CM | POA: Diagnosis not present

## 2019-04-06 DIAGNOSIS — R2689 Other abnormalities of gait and mobility: Secondary | ICD-10-CM | POA: Diagnosis not present

## 2019-04-06 DIAGNOSIS — M25461 Effusion, right knee: Secondary | ICD-10-CM | POA: Diagnosis not present

## 2019-04-06 DIAGNOSIS — M25561 Pain in right knee: Secondary | ICD-10-CM | POA: Diagnosis not present

## 2019-04-06 DIAGNOSIS — M25661 Stiffness of right knee, not elsewhere classified: Secondary | ICD-10-CM | POA: Diagnosis not present

## 2019-04-13 DIAGNOSIS — M25461 Effusion, right knee: Secondary | ICD-10-CM | POA: Diagnosis not present

## 2019-04-13 DIAGNOSIS — M25661 Stiffness of right knee, not elsewhere classified: Secondary | ICD-10-CM | POA: Diagnosis not present

## 2019-04-13 DIAGNOSIS — M25561 Pain in right knee: Secondary | ICD-10-CM | POA: Diagnosis not present

## 2019-04-13 DIAGNOSIS — R2689 Other abnormalities of gait and mobility: Secondary | ICD-10-CM | POA: Diagnosis not present

## 2019-05-02 DIAGNOSIS — M25461 Effusion, right knee: Secondary | ICD-10-CM | POA: Diagnosis not present

## 2019-05-02 DIAGNOSIS — M25661 Stiffness of right knee, not elsewhere classified: Secondary | ICD-10-CM | POA: Diagnosis not present

## 2019-05-02 DIAGNOSIS — R2689 Other abnormalities of gait and mobility: Secondary | ICD-10-CM | POA: Diagnosis not present

## 2019-05-02 DIAGNOSIS — M25561 Pain in right knee: Secondary | ICD-10-CM | POA: Diagnosis not present

## 2019-11-15 DIAGNOSIS — Z1231 Encounter for screening mammogram for malignant neoplasm of breast: Secondary | ICD-10-CM | POA: Diagnosis not present

## 2019-11-15 DIAGNOSIS — Z6826 Body mass index (BMI) 26.0-26.9, adult: Secondary | ICD-10-CM | POA: Diagnosis not present

## 2019-11-15 DIAGNOSIS — Z01419 Encounter for gynecological examination (general) (routine) without abnormal findings: Secondary | ICD-10-CM | POA: Diagnosis not present

## 2019-11-17 ENCOUNTER — Other Ambulatory Visit (HOSPITAL_COMMUNITY): Payer: Self-pay | Admitting: Obstetrics and Gynecology

## 2019-11-17 ENCOUNTER — Other Ambulatory Visit: Payer: Self-pay | Admitting: Obstetrics and Gynecology

## 2019-11-17 DIAGNOSIS — E041 Nontoxic single thyroid nodule: Secondary | ICD-10-CM

## 2019-11-24 ENCOUNTER — Other Ambulatory Visit: Payer: Self-pay

## 2019-11-24 ENCOUNTER — Ambulatory Visit (HOSPITAL_COMMUNITY)
Admission: RE | Admit: 2019-11-24 | Discharge: 2019-11-24 | Disposition: A | Discharge status: Home / Self Care | Payer: Medicare HMO | Source: Ambulatory Visit | Attending: Obstetrics and Gynecology | Admitting: Obstetrics and Gynecology

## 2019-11-24 DIAGNOSIS — E041 Nontoxic single thyroid nodule: Secondary | ICD-10-CM | POA: Insufficient documentation

## 2019-12-11 ENCOUNTER — Other Ambulatory Visit: Payer: Self-pay | Admitting: Obstetrics and Gynecology

## 2019-12-11 DIAGNOSIS — Z9189 Other specified personal risk factors, not elsewhere classified: Secondary | ICD-10-CM

## 2020-01-07 ENCOUNTER — Ambulatory Visit
Admission: RE | Admit: 2020-01-07 | Discharge: 2020-01-07 | Disposition: A | Discharge status: Home / Self Care | Payer: Medicare HMO | Source: Ambulatory Visit | Attending: Obstetrics and Gynecology | Admitting: Obstetrics and Gynecology

## 2020-01-07 ENCOUNTER — Other Ambulatory Visit: Payer: Self-pay

## 2020-01-07 DIAGNOSIS — N6489 Other specified disorders of breast: Secondary | ICD-10-CM | POA: Diagnosis not present

## 2020-01-07 DIAGNOSIS — Z9189 Other specified personal risk factors, not elsewhere classified: Secondary | ICD-10-CM

## 2020-01-07 MED ORDER — GADOBUTROL 1 MMOL/ML IV SOLN
7.0000 mL | Freq: Once | INTRAVENOUS | Status: AC | PRN
  Administered 2020-01-07: 7 mL via INTRAVENOUS

## 2020-01-24 DIAGNOSIS — M5416 Radiculopathy, lumbar region: Secondary | ICD-10-CM | POA: Diagnosis not present

## 2020-01-24 DIAGNOSIS — M5136 Other intervertebral disc degeneration, lumbar region: Secondary | ICD-10-CM | POA: Diagnosis not present

## 2020-02-15 DIAGNOSIS — M5416 Radiculopathy, lumbar region: Secondary | ICD-10-CM | POA: Diagnosis not present

## 2020-03-19 DIAGNOSIS — M25562 Pain in left knee: Secondary | ICD-10-CM | POA: Diagnosis not present

## 2020-03-28 DIAGNOSIS — Z86010 Personal history of colonic polyps: Secondary | ICD-10-CM | POA: Diagnosis not present

## 2020-03-28 DIAGNOSIS — K219 Gastro-esophageal reflux disease without esophagitis: Secondary | ICD-10-CM | POA: Diagnosis not present

## 2020-03-28 DIAGNOSIS — K5730 Diverticulosis of large intestine without perforation or abscess without bleeding: Secondary | ICD-10-CM | POA: Diagnosis not present

## 2020-04-11 DIAGNOSIS — I10 Essential (primary) hypertension: Secondary | ICD-10-CM | POA: Diagnosis not present

## 2020-04-11 DIAGNOSIS — J4520 Mild intermittent asthma, uncomplicated: Secondary | ICD-10-CM | POA: Diagnosis not present

## 2020-04-11 DIAGNOSIS — E039 Hypothyroidism, unspecified: Secondary | ICD-10-CM | POA: Diagnosis not present

## 2020-04-11 DIAGNOSIS — Z79899 Other long term (current) drug therapy: Secondary | ICD-10-CM | POA: Diagnosis not present

## 2020-04-11 DIAGNOSIS — E7800 Pure hypercholesterolemia, unspecified: Secondary | ICD-10-CM | POA: Diagnosis not present

## 2020-04-11 DIAGNOSIS — Z0000 Encounter for general adult medical examination without abnormal findings: Secondary | ICD-10-CM | POA: Diagnosis not present

## 2020-05-03 DIAGNOSIS — M1712 Unilateral primary osteoarthritis, left knee: Secondary | ICD-10-CM | POA: Diagnosis not present

## 2020-05-03 DIAGNOSIS — Z96651 Presence of right artificial knee joint: Secondary | ICD-10-CM | POA: Diagnosis not present

## 2020-05-24 DIAGNOSIS — L820 Inflamed seborrheic keratosis: Secondary | ICD-10-CM | POA: Diagnosis not present

## 2020-05-24 DIAGNOSIS — X32XXXA Exposure to sunlight, initial encounter: Secondary | ICD-10-CM | POA: Diagnosis not present

## 2020-05-24 DIAGNOSIS — L570 Actinic keratosis: Secondary | ICD-10-CM | POA: Diagnosis not present

## 2020-06-04 ENCOUNTER — Other Ambulatory Visit (HOSPITAL_COMMUNITY): Payer: Self-pay | Admitting: Sports Medicine

## 2020-06-04 ENCOUNTER — Other Ambulatory Visit: Payer: Self-pay

## 2020-06-04 ENCOUNTER — Ambulatory Visit (HOSPITAL_COMMUNITY)
Admission: RE | Admit: 2020-06-04 | Discharge: 2020-06-04 | Disposition: A | Discharge status: Home / Self Care | Payer: Medicare HMO | Source: Ambulatory Visit | Attending: Physician Assistant | Admitting: Physician Assistant

## 2020-06-04 DIAGNOSIS — M25562 Pain in left knee: Secondary | ICD-10-CM | POA: Diagnosis not present

## 2020-06-04 DIAGNOSIS — M79662 Pain in left lower leg: Secondary | ICD-10-CM | POA: Diagnosis not present

## 2020-06-04 DIAGNOSIS — M79604 Pain in right leg: Secondary | ICD-10-CM | POA: Diagnosis not present

## 2020-06-04 DIAGNOSIS — M79661 Pain in right lower leg: Secondary | ICD-10-CM | POA: Diagnosis not present

## 2020-06-04 DIAGNOSIS — M79605 Pain in left leg: Secondary | ICD-10-CM | POA: Diagnosis not present

## 2020-06-21 DIAGNOSIS — L820 Inflamed seborrheic keratosis: Secondary | ICD-10-CM | POA: Diagnosis not present

## 2020-06-21 DIAGNOSIS — X32XXXD Exposure to sunlight, subsequent encounter: Secondary | ICD-10-CM | POA: Diagnosis not present

## 2020-06-21 DIAGNOSIS — L570 Actinic keratosis: Secondary | ICD-10-CM | POA: Diagnosis not present

## 2020-07-03 DIAGNOSIS — I10 Essential (primary) hypertension: Secondary | ICD-10-CM | POA: Diagnosis not present

## 2020-07-03 DIAGNOSIS — Z01818 Encounter for other preprocedural examination: Secondary | ICD-10-CM | POA: Diagnosis not present

## 2020-07-03 DIAGNOSIS — E7800 Pure hypercholesterolemia, unspecified: Secondary | ICD-10-CM | POA: Diagnosis not present

## 2020-07-03 DIAGNOSIS — E039 Hypothyroidism, unspecified: Secondary | ICD-10-CM | POA: Diagnosis not present

## 2020-07-17 ENCOUNTER — Ambulatory Visit
Admission: RE | Admit: 2020-07-17 | Discharge: 2020-07-17 | Disposition: A | Discharge status: Home / Self Care | Payer: Medicare HMO | Source: Ambulatory Visit | Attending: Family Medicine | Admitting: Family Medicine

## 2020-07-17 ENCOUNTER — Other Ambulatory Visit: Payer: Self-pay

## 2020-07-17 ENCOUNTER — Other Ambulatory Visit: Payer: Self-pay | Admitting: Family Medicine

## 2020-07-17 DIAGNOSIS — Z01818 Encounter for other preprocedural examination: Secondary | ICD-10-CM

## 2020-07-17 NOTE — Progress Notes (Signed)
DUE TO COVID-19 ONLY ONE VISITOR IS ALLOWED TO COME WITH YOU AND STAY IN THE WAITING ROOM ONLY DURING PRE OP AND PROCEDURE DAY OF SURGERY. THE 1 VISITOR  MAY VISIT WITH YOU AFTER SURGERY IN YOUR PRIVATE ROOM DURING VISITING HOURS ONLY!  YOU NEED TO HAVE A COVID 19 TEST ON___5/30/2022 ____ @_______ , THIS TEST MUST BE DONE BEFORE SURGERY,  COVID TESTING SITE [address] [place] , IT IS ON THE RIGHT GOING OUT [address] APPROXIMATELY  2 MINUTES PAST [employer] ON THE RIGHT. ONCE YOUR COVID TEST IS COMPLETED,  PLEASE BEGIN THE QUARANTINE INSTRUCTIONS AS OUTLINED IN YOUR HANDOUT.                Madalyne Husk Surgeon  07/17/2020   Your procedure is scheduled on:                 07/30/20   Report to [facility] Main  Entrance   Report to admitting at     0730 AM     Call this number if you have problems the morning of surgery [phone]    REMEMBER: NO  SOLID FOOD CANDY OR GUM AFTER MIDNIGHT. CLEAR LIQUIDS UNTIL 0700am         . NOTHING BY MOUTH EXCEPT CLEAR LIQUIDS UNTIL       0700am  . PLEASE FINISH ENSURE DRINK PER SURGEON ORDER  WHICH NEEDS TO BE COMPLETED AT   0700am    .      CLEAR LIQUID DIET   Foods Allowed                                                                    Coffee and tea, regular and decaf                            Fruit ices (not with fruit pulp)                                      Iced Popsicles                                    Carbonated beverages, regular and diet                                    Cranberry, grape and apple juices Sports drinks like Gatorade Lightly seasoned clear broth or consume(fat free) Sugar, honey syrup ___________________________________________________________________      BRUSH YOUR TEETH MORNING OF SURGERY AND RINSE YOUR MOUTH OUT, NO CHEWING GUM CANDY OR MINTS.     Take these medicines the morning of surgery with A SIP OF WATER:  Synthroid, inhalers as usual and bring,  amlodipine  DO NOT TAKE ANY DIABETIC MEDICATIONS DAY OF YOUR SURGERY                               You may not  have any metal on your body including hair pins and              piercings  Do not wear jewelry, make-up, lotions, powders or perfumes, deodorant             Do not wear nail polish on your fingernails.  Do not shave  48 hours prior to surgery.              Men may shave face and neck.   Do not bring valuables to the hospital. [facility] IS NOT             RESPONSIBLE   FOR VALUABLES.  Contacts, dentures or bridgework may not be worn into surgery.  Leave suitcase in the car. After surgery it may be brought to your room.     Patients discharged the day of surgery will not be allowed to drive home. IF YOU ARE HAVING SURGERY AND GOING HOME THE SAME DAY, YOU MUST HAVE AN ADULT TO DRIVE YOU HOME AND BE WITH YOU FOR 24 HOURS. YOU MAY GO HOME BY TAXI OR UBER OR ORTHERWISE, BUT AN ADULT MUST ACCOMPANY YOU HOME AND STAY WITH YOU FOR 24 HOURS.  Name and phone number of your driver:  Special Instructions: N/A              Please read over the following fact sheets you were given: _____________________________________________________________________  [facility] - Preparing for Surgery Before surgery, you can play an important role.  Because skin is not sterile, your skin needs to be as free of germs as possible.  You can reduce the number of germs on your skin by washing with CHG (chlorahexidine gluconate) soap before surgery.  CHG is an antiseptic cleaner which kills germs and bonds with the skin to continue killing germs even after washing. Please DO NOT use if you have an allergy to CHG or antibacterial soaps.  If your skin becomes reddened/irritated stop using the CHG and inform your nurse when you arrive at Short Stay. Do not shave (including legs and underarms) for at least 48 hours prior to the first CHG shower.  You may shave your face/neck. Please follow these instructions  carefully:  1.  Shower with CHG Soap the night before surgery and the  morning of Surgery.  2.  If you choose to wash your hair, wash your hair first as usual with your  normal  shampoo.  3.  After you shampoo, rinse your hair and body thoroughly to remove the  shampoo.                           4.  Use CHG as you would any other liquid soap.  You can apply chg directly  to the skin and wash                       Gently with a scrungie or clean washcloth.  5.  Apply the CHG Soap to your body ONLY FROM THE NECK DOWN.   Do not use on face/ open                           Wound or open sores. Avoid contact with eyes, ears mouth and genitals (private parts).  Wash face,  Genitals (private parts) with your normal soap.             6.  Wash thoroughly, paying special attention to the area where your surgery  will be performed.  7.  Thoroughly rinse your body with warm water from the neck down.  8.  DO NOT shower/wash with your normal soap after using and rinsing off  the CHG Soap.                9.  Pat yourself dry with a clean towel.            10.  Wear clean pajamas.            11.  Place clean sheets on your bed the night of your first shower and do not  sleep with pets. Day of Surgery : Do not apply any lotions/deodorants the morning of surgery.  Please wear clean clothes to the hospital/surgery center.  FAILURE TO FOLLOW THESE INSTRUCTIONS MAY RESULT IN THE CANCELLATION OF YOUR SURGERY PATIENT SIGNATURE_________________________________  NURSE SIGNATURE__________________________________  ________________________________________________________________________

## 2020-07-22 ENCOUNTER — Encounter (HOSPITAL_COMMUNITY)
Admission: RE | Admit: 2020-07-22 | Discharge: 2020-07-22 | Disposition: A | Discharge status: Home / Self Care | Payer: Medicare HMO | Source: Ambulatory Visit | Attending: Orthopedic Surgery | Admitting: Orthopedic Surgery

## 2020-07-22 ENCOUNTER — Encounter (HOSPITAL_COMMUNITY): Payer: Self-pay

## 2020-07-22 ENCOUNTER — Other Ambulatory Visit: Payer: Self-pay

## 2020-07-22 DIAGNOSIS — Z01812 Encounter for preprocedural laboratory examination: Secondary | ICD-10-CM | POA: Insufficient documentation

## 2020-07-22 LAB — COMPREHENSIVE METABOLIC PANEL
ALT: 21 U/L (ref 0–44)
AST: 19 U/L (ref 15–41)
Albumin: 4.2 g/dL (ref 3.5–5.0)
Alkaline Phosphatase: 81 U/L (ref 38–126)
Anion gap: 6 (ref 5–15)
BUN: 29 mg/dL — ABNORMAL HIGH (ref 8–23)
CO2: 24 mmol/L (ref 22–32)
Calcium: 9.5 mg/dL (ref 8.9–10.3)
Chloride: 105 mmol/L (ref 98–111)
Creatinine, Ser: 0.83 mg/dL (ref 0.44–1.00)
GFR, Estimated: >60 mL/min (ref >60)
Glucose, Bld: 103 mg/dL — ABNORMAL HIGH (ref 70–99)
Potassium: 4.1 mmol/L (ref 3.5–5.1)
Sodium: 135 mmol/L (ref 135–145)
Total Bilirubin: 0.9 mg/dL (ref 0.3–1.2)
Total Protein: 7.2 g/dL (ref 6.5–8.1)

## 2020-07-22 LAB — CBC
HCT: 39.2 % (ref 36.0–46.0)
Hemoglobin: 13 g/dL (ref 12.0–15.0)
MCH: 31.9 pg (ref 26.0–34.0)
MCHC: 33.2 g/dL (ref 30.0–36.0)
MCV: 96.3 fL (ref 80.0–100.0)
Platelets: 233 10*3/uL (ref 150–400)
RBC: 4.07 MIL/uL (ref 3.87–5.11)
RDW: 12.3 % (ref 11.5–15.5)
WBC: 5.1 10*3/uL (ref 4.0–10.5)
nRBC: 0 % (ref 0.0–0.2)

## 2020-07-22 LAB — APTT: aPTT: 32 seconds (ref 24–36)

## 2020-07-22 LAB — SURGICAL PCR SCREEN
MRSA, PCR: POSITIVE — AB
Staphylococcus aureus: POSITIVE — AB

## 2020-07-22 LAB — PROTIME-INR
INR: 1 (ref 0.8–1.2)
Prothrombin Time: 13.1 seconds (ref 11.4–15.2)

## 2020-07-22 NOTE — Progress Notes (Addendum)
Anesthesia Review:  PCP: DR Docia Chuck with [facility] Requested LOV note and clearance  From [facility] and [facility] at [facility]  LOV 07/03/20 on chart - clearance in note  Cardiologist : none  Chest x-ray : pt reported cxr done at dr Docia Chuck- requested also  07/19/20- results of cxr in epic  EKG : 07/03/20 - on chart  Echo : Stress test: Cardiac Cath :  Activity level: can do aflgiht of stairs iwthout difficulty  Sleep Study/ CPAP : mild sleep apnea  no cpap  Fasting Blood Sugar :      / Checks Blood Sugar -- times a day:   Blood Thinner/ Instructions /Last Dose: ASA / Instructions/ Last Dose :

## 2020-07-23 NOTE — H&P (Signed)
TOTAL KNEE ADMISSION H&P  Patient is being admitted for left total knee arthroplasty.  Subjective:  Chief Complaint:left knee pain.  HPI: Melissa Collier, 70 y.o. female, has a history of pain and functional disability in the left knee due to arthritis and has failed non-surgical conservative treatments for greater than 12 weeks to includeNSAID's and/or analgesics, corticosteriod injections and activity modification.  Onset of symptoms was gradual, starting 2 years ago with gradually worsening course since that time. The patient noted no past surgery on the left knee(s).  Patient currently rates pain in the left knee(s) at 6 out of 10 with activity. Patient has worsening of pain with activity and weight bearing and pain that interferes with activities of daily living.  Patient has evidence of joint space narrowing by imaging studies. There is no active infection.  Patient Active Problem List   Diagnosis Date Noted  . Overweight (BMI 25.0-29.9) 02/01/2019  . S/P right TKA 01/31/2019   Past Medical History:  Diagnosis Date  . Arthritis   . Asthma   . GERD (gastroesophageal reflux disease)   . Hashimoto's thyroiditis   . History of hiatal hernia   . Hyperlipidemia   . Hypertension   . Hypothyroidism   . Multinodular goiter   . Osteopenia   . Pneumonia    History of  . Sciatica    Left greater than right  . Sleep apnea    Mild, NO CPAP    Past Surgical History:  Procedure Laterality Date  . COLONOSCOPY    . DIAGNOSTIC LAPAROSCOPY  1991   x2  . ECTOPIC PREGNANCY SURGERY    . TOTAL KNEE ARTHROPLASTY Right 01/31/2019   Procedure: TOTAL KNEE ARTHROPLASTY;  Surgeon: Durene Romans, MD;  Location: [facility];  Service: Orthopedics;  Laterality: Right;  . UPPER GI ENDOSCOPY      No current facility-administered medications for this encounter.   Current Outpatient Medications  Medication Sig Dispense Refill Last Dose  . albuterol (VENTOLIN HFA) 108 (90 Base) MCG/ACT inhaler Inhale  2 puffs into the lungs every 4 (four) hours as needed for shortness of breath or wheezing.     [name] Kitchen amLODipine (NORVASC) 5 MG tablet Take 5 mg by mouth daily.     [name] Kitchen atorvastatin (LIPITOR) 20 MG tablet Take 20 mg by mouth at bedtime.     . calcium carbonate (OS-CAL) 600 MG TABS tablet Take 600 mg by mouth daily with breakfast.     . diclofenac (VOLTAREN) 75 MG EC tablet Take 75 mg by mouth daily.     [name] Kitchen levothyroxine (SYNTHROID) 75 MCG tablet Take 1 tablet (75 mcg total) by mouth daily.     . meloxicam (MOBIC) 15 MG tablet Take 15 mg by mouth daily.     . methocarbamol (ROBAXIN) 500 MG tablet Take 1 tablet (500 mg total) by mouth every 6 (six) hours as needed for muscle spasms. 40 tablet 0   . omeprazole (PRILOSEC) 40 MG capsule Take 40 mg by mouth at bedtime.      . valsartan-hydrochlorothiazide (DIOVAN-HCT) 320-25 MG tablet Take 1 tablet by mouth daily.      Allergies  Allergen Reactions  . Dust Mite Extract     Cat /  causes runny eyes    Social History   Tobacco Use  . Smoking status: Never Smoker  . Smokeless tobacco: Never Used  Substance Use Topics  . Alcohol use: Yes    Comment: 1-2 glasses of wine nightly     No  family history on file.   Review of Systems  Constitutional: Negative for chills and fever.  Respiratory: Negative for cough and shortness of breath.   Cardiovascular: Negative for chest pain.  Gastrointestinal: Negative for nausea and vomiting.  Musculoskeletal: Positive for arthralgias.    Objective:  Physical Exam Well nourished and well developed. General: Alert and oriented x3, cooperative and pleasant, no acute distress. Head: normocephalic, atraumatic, neck supple. Eyes: EOMI.  Musculoskeletal: Left knee exam: No palpable effusion warmth or erythema Tenderness medially more so than lateral Stable and intact ligaments medially and laterally with some pseudolaxity medially Full knee extension and flexion of 120 degrees with some tightness  Calves  soft and nontender. Motor function intact in LE. Strength 5/5 LE bilaterally. Neuro: Distal pulses 2+. Sensation to light touch intact in LE.  Vital signs in last 24 hours:    Labs:   Estimated body mass index is 26.55 kg/m as calculated from the following:   Height as of 07/22/20: 5' 5.5" (1.664 m).   Weight as of 07/22/20: 73.5 kg.   Imaging Review Plain radiographs demonstrate severe degenerative joint disease of the left knee(s). The overall alignment isneutral. The bone quality appears to be adequate for age and reported activity level.   Assessment/Plan:  End stage arthritis, left knee   The patient history, physical examination, clinical judgment of the provider and imaging studies are consistent with end stage degenerative joint disease of the left knee(s) and total knee arthroplasty is deemed medically necessary. The treatment options including medical management, injection therapy arthroscopy and arthroplasty were discussed at length. The risks and benefits of total knee arthroplasty were presented and reviewed. The risks due to aseptic loosening, infection, stiffness, patella tracking problems, thromboembolic complications and other imponderables were discussed. The patient acknowledged the explanation, agreed to proceed with the plan and consent was signed. Patient is being admitted for inpatient treatment for surgery, pain control, PT, OT, prophylactic antibiotics, VTE prophylaxis, progressive ambulation and ADL's and discharge planning. The patient is planning to be discharged home.  Therapy Plans: outpatient therapy at [facility] in [place] Disposition: Home with husband Planned DVT Prophylaxis: aspirin 81mg  BID DME needed: none PCP: Dr. TXA: IV Allergies: NKDA Anesthesia Concerns: none BMI: 25.4 Not diabetic.  Other:  - Hydrocodone is okay - Staying overnight  Patient's anticipated LOS is less than 2 midnights, meeting these requirements: - Younger than  15 - Lives within 1 hour of care - Has a competent adult at home to recover with post-op recover - NO history of  - Chronic pain requiring opiods  - Diabetes  - Coronary Artery Disease  - Heart failure  - Heart attack  - Stroke  - DVT/VTE  - Cardiac arrhythmia  - Respiratory Failure/COPD  - Renal failure  - Anemia  - Advanced Liver disease  75, PA-C Orthopedic Surgery [facility] [place] [phone]

## 2020-07-26 ENCOUNTER — Other Ambulatory Visit (HOSPITAL_COMMUNITY)
Admission: RE | Admit: 2020-07-26 | Discharge: 2020-07-26 | Disposition: A | Discharge status: Home / Self Care | Payer: Medicare HMO | Source: Ambulatory Visit | Attending: Orthopedic Surgery | Admitting: Orthopedic Surgery

## 2020-07-26 DIAGNOSIS — Z01812 Encounter for preprocedural laboratory examination: Secondary | ICD-10-CM | POA: Diagnosis not present

## 2020-07-26 DIAGNOSIS — Z01818 Encounter for other preprocedural examination: Secondary | ICD-10-CM | POA: Diagnosis not present

## 2020-07-26 DIAGNOSIS — Z20822 Contact with and (suspected) exposure to covid-19: Secondary | ICD-10-CM | POA: Insufficient documentation

## 2020-07-26 LAB — SARS CORONAVIRUS 2 (TAT 6-24 HRS): SARS Coronavirus 2: NEGATIVE

## 2020-07-30 ENCOUNTER — Encounter (HOSPITAL_COMMUNITY)
Admission: RE | Disposition: A | Discharge status: Home / Self Care | Payer: Self-pay | Source: Ambulatory Visit | Attending: Orthopedic Surgery

## 2020-07-30 ENCOUNTER — Other Ambulatory Visit: Payer: Self-pay

## 2020-07-30 ENCOUNTER — Encounter (HOSPITAL_COMMUNITY): Payer: Self-pay | Admitting: Orthopedic Surgery

## 2020-07-30 ENCOUNTER — Ambulatory Visit (HOSPITAL_COMMUNITY): Payer: Medicare HMO | Admitting: Physician Assistant

## 2020-07-30 ENCOUNTER — Ambulatory Visit (HOSPITAL_COMMUNITY): Payer: Medicare HMO | Admitting: Certified Registered Nurse Anesthetist

## 2020-07-30 ENCOUNTER — Observation Stay (HOSPITAL_COMMUNITY)
Admission: RE | Admit: 2020-07-30 | Discharge: 2020-07-31 | Disposition: A | Discharge status: Home / Self Care | Payer: Medicare HMO | Source: Ambulatory Visit | Attending: Orthopedic Surgery | Admitting: Orthopedic Surgery

## 2020-07-30 DIAGNOSIS — I10 Essential (primary) hypertension: Secondary | ICD-10-CM | POA: Diagnosis not present

## 2020-07-30 DIAGNOSIS — J45909 Unspecified asthma, uncomplicated: Secondary | ICD-10-CM | POA: Diagnosis not present

## 2020-07-30 DIAGNOSIS — M1712 Unilateral primary osteoarthritis, left knee: Secondary | ICD-10-CM | POA: Diagnosis not present

## 2020-07-30 DIAGNOSIS — Z79899 Other long term (current) drug therapy: Secondary | ICD-10-CM | POA: Insufficient documentation

## 2020-07-30 DIAGNOSIS — Z96652 Presence of left artificial knee joint: Secondary | ICD-10-CM

## 2020-07-30 DIAGNOSIS — Z96651 Presence of right artificial knee joint: Secondary | ICD-10-CM | POA: Insufficient documentation

## 2020-07-30 DIAGNOSIS — E785 Hyperlipidemia, unspecified: Secondary | ICD-10-CM | POA: Diagnosis not present

## 2020-07-30 DIAGNOSIS — E039 Hypothyroidism, unspecified: Secondary | ICD-10-CM | POA: Diagnosis not present

## 2020-07-30 DIAGNOSIS — G8918 Other acute postprocedural pain: Secondary | ICD-10-CM | POA: Diagnosis not present

## 2020-07-30 HISTORY — PX: TOTAL KNEE ARTHROPLASTY: SHX125

## 2020-07-30 LAB — TYPE AND SCREEN
ABO/RH(D): A POS
Antibody Screen: NEGATIVE

## 2020-07-30 SURGERY — ARTHROPLASTY, KNEE, TOTAL
Anesthesia: Monitor Anesthesia Care | Site: Knee | Laterality: Left

## 2020-07-30 MED ORDER — CEFAZOLIN SODIUM-DEXTROSE 2-4 GM/100ML-% IV SOLN
2.0000 g | INTRAVENOUS | Status: AC
  Administered 2020-07-30: 2 g via INTRAVENOUS
  Filled 2020-07-30: qty 100

## 2020-07-30 MED ORDER — DOCUSATE SODIUM 100 MG PO CAPS
100.0000 mg | ORAL_CAPSULE | Freq: Two times a day (BID) | ORAL | Status: DC
  Administered 2020-07-30 – 2020-07-31 (×2): 100 mg via ORAL
  Filled 2020-07-30 (×2): qty 1

## 2020-07-30 MED ORDER — ASPIRIN 81 MG PO CHEW
81.0000 mg | CHEWABLE_TABLET | Freq: Two times a day (BID) | ORAL | Status: DC
  Administered 2020-07-30 – 2020-07-31 (×2): 81 mg via ORAL
  Filled 2020-07-30 (×2): qty 1

## 2020-07-30 MED ORDER — KETOROLAC TROMETHAMINE 30 MG/ML IJ SOLN
INTRAMUSCULAR | Status: DC | PRN
  Administered 2020-07-30: 30 mg

## 2020-07-30 MED ORDER — PANTOPRAZOLE SODIUM 40 MG PO TBEC
40.0000 mg | DELAYED_RELEASE_TABLET | Freq: Every day | ORAL | Status: DC
  Administered 2020-07-30: 40 mg via ORAL
  Filled 2020-07-30: qty 1

## 2020-07-30 MED ORDER — TRANEXAMIC ACID-NACL 1000-0.7 MG/100ML-% IV SOLN
1000.0000 mg | Freq: Once | INTRAVENOUS | Status: AC
  Administered 2020-07-30: 1000 mg via INTRAVENOUS
  Filled 2020-07-30: qty 100

## 2020-07-30 MED ORDER — BUPIVACAINE-EPINEPHRINE (PF) 0.25% -1:200000 IJ SOLN
INTRAMUSCULAR | Status: DC | PRN
  Administered 2020-07-30: 30 mL

## 2020-07-30 MED ORDER — POVIDONE-IODINE 10 % EX SWAB
2.0000 "application " | Freq: Once | CUTANEOUS | Status: AC
  Administered 2020-07-30: 2 via TOPICAL

## 2020-07-30 MED ORDER — ONDANSETRON HCL 4 MG PO TABS: 4.0000 mg | ORAL_TABLET | Freq: Four times a day (QID) | ORAL | Status: DC | PRN

## 2020-07-30 MED ORDER — METHOCARBAMOL 500 MG PO TABS
500.0000 mg | ORAL_TABLET | Freq: Four times a day (QID) | ORAL | Status: DC | PRN
  Administered 2020-07-30 – 2020-07-31 (×2): 500 mg via ORAL
  Filled 2020-07-30 (×2): qty 1

## 2020-07-30 MED ORDER — LIDOCAINE 2% (20 MG/ML) 5 ML SYRINGE
INTRAMUSCULAR | Status: DC | PRN
  Administered 2020-07-30 (×2): 40 mg via INTRAVENOUS

## 2020-07-30 MED ORDER — PHENYLEPHRINE HCL-NACL 10-0.9 MG/250ML-% IV SOLN
INTRAVENOUS | Status: DC | PRN
  Administered 2020-07-30: 20 ug/min via INTRAVENOUS

## 2020-07-30 MED ORDER — HYDROCHLOROTHIAZIDE 25 MG PO TABS
25.0000 mg | ORAL_TABLET | Freq: Every day | ORAL | Status: DC
  Administered 2020-07-31: 25 mg via ORAL
  Filled 2020-07-30: qty 1

## 2020-07-30 MED ORDER — PROPOFOL 10 MG/ML IV BOLUS
INTRAVENOUS | Status: DC | PRN
  Administered 2020-07-30: 20 mg via INTRAVENOUS
  Administered 2020-07-30: 50 mg via INTRAVENOUS
  Administered 2020-07-30: 60 mg via INTRAVENOUS

## 2020-07-30 MED ORDER — TRANEXAMIC ACID-NACL 1000-0.7 MG/100ML-% IV SOLN
1000.0000 mg | INTRAVENOUS | Status: AC
  Administered 2020-07-30: 1000 mg via INTRAVENOUS
  Filled 2020-07-30: qty 100

## 2020-07-30 MED ORDER — BUPIVACAINE IN DEXTROSE 0.75-8.25 % IT SOLN
INTRATHECAL | Status: DC | PRN
  Administered 2020-07-30: 1.6 mL via INTRATHECAL

## 2020-07-30 MED ORDER — DEXAMETHASONE SODIUM PHOSPHATE 10 MG/ML IJ SOLN
10.0000 mg | Freq: Once | INTRAMUSCULAR | Status: AC
  Administered 2020-07-31: 10 mg via INTRAVENOUS
  Filled 2020-07-30: qty 1

## 2020-07-30 MED ORDER — METOCLOPRAMIDE HCL 5 MG/ML IJ SOLN: 5.0000 mg | Freq: Three times a day (TID) | INTRAMUSCULAR | Status: DC | PRN

## 2020-07-30 MED ORDER — AMLODIPINE BESYLATE 5 MG PO TABS
5.0000 mg | ORAL_TABLET | Freq: Every day | ORAL | Status: DC
  Administered 2020-07-31: 5 mg via ORAL
  Filled 2020-07-30: qty 1

## 2020-07-30 MED ORDER — EPHEDRINE SULFATE-NACL 50-0.9 MG/10ML-% IV SOSY
PREFILLED_SYRINGE | INTRAVENOUS | Status: DC | PRN
  Administered 2020-07-30 (×2): 10 mg via INTRAVENOUS

## 2020-07-30 MED ORDER — PHENYLEPHRINE 40 MCG/ML (10ML) SYRINGE FOR IV PUSH (FOR BLOOD PRESSURE SUPPORT)
PREFILLED_SYRINGE | INTRAVENOUS | Status: DC | PRN
  Administered 2020-07-30 (×5): 80 ug via INTRAVENOUS

## 2020-07-30 MED ORDER — PHENOL 1.4 % MT LIQD: 1.0000 | OROMUCOSAL | Status: DC | PRN

## 2020-07-30 MED ORDER — MORPHINE SULFATE (PF) 2 MG/ML IV SOLN
0.5000 mg | INTRAVENOUS | Status: DC | PRN
  Administered 2020-07-30: 0.5 mg via INTRAVENOUS
  Administered 2020-07-30: 1 mg via INTRAVENOUS
  Filled 2020-07-30 (×2): qty 1

## 2020-07-30 MED ORDER — ONDANSETRON HCL 4 MG/2ML IJ SOLN
INTRAMUSCULAR | Status: AC
  Filled 2020-07-30: qty 2

## 2020-07-30 MED ORDER — IRBESARTAN 150 MG PO TABS
300.0000 mg | ORAL_TABLET | Freq: Every day | ORAL | Status: DC
  Administered 2020-07-31: 300 mg via ORAL
  Filled 2020-07-30: qty 2

## 2020-07-30 MED ORDER — POLYETHYLENE GLYCOL 3350 17 G PO PACK: 17.0000 g | PACK | Freq: Every day | ORAL | Status: DC | PRN

## 2020-07-30 MED ORDER — PROMETHAZINE HCL 25 MG/ML IJ SOLN: 6.2500 mg | INTRAMUSCULAR | Status: DC | PRN

## 2020-07-30 MED ORDER — BUPIVACAINE HCL (PF) 0.5 % IJ SOLN
INTRAMUSCULAR | Status: DC | PRN
  Administered 2020-07-30: 20 mL via PERINEURAL

## 2020-07-30 MED ORDER — HYDROCODONE-ACETAMINOPHEN 5-325 MG PO TABS
1.0000 | ORAL_TABLET | ORAL | Status: DC | PRN
  Administered 2020-07-30 – 2020-07-31 (×3): 2 via ORAL
  Filled 2020-07-30 (×4): qty 2

## 2020-07-30 MED ORDER — CELECOXIB 200 MG PO CAPS
200.0000 mg | ORAL_CAPSULE | Freq: Two times a day (BID) | ORAL | Status: DC
  Administered 2020-07-30 – 2020-07-31 (×2): 200 mg via ORAL
  Filled 2020-07-30 (×2): qty 1

## 2020-07-30 MED ORDER — DIPHENHYDRAMINE HCL 12.5 MG/5ML PO ELIX: 12.5000 mg | ORAL_SOLUTION | ORAL | Status: DC | PRN

## 2020-07-30 MED ORDER — MENTHOL 3 MG MT LOZG: 1.0000 | LOZENGE | OROMUCOSAL | Status: DC | PRN

## 2020-07-30 MED ORDER — ATORVASTATIN CALCIUM 20 MG PO TABS
20.0000 mg | ORAL_TABLET | Freq: Every day | ORAL | Status: DC
  Administered 2020-07-30: 20 mg via ORAL
  Filled 2020-07-30: qty 1

## 2020-07-30 MED ORDER — CEFAZOLIN SODIUM-DEXTROSE 2-4 GM/100ML-% IV SOLN
2.0000 g | Freq: Four times a day (QID) | INTRAVENOUS | Status: AC
  Administered 2020-07-30 (×2): 2 g via INTRAVENOUS
  Filled 2020-07-30 (×2): qty 100

## 2020-07-30 MED ORDER — OXYCODONE HCL 5 MG/5ML PO SOLN: 5.0000 mg | Freq: Once | ORAL | Status: DC | PRN

## 2020-07-30 MED ORDER — SODIUM CHLORIDE (PF) 0.9 % IJ SOLN
INTRAMUSCULAR | Status: DC | PRN
  Administered 2020-07-30: 30 mL

## 2020-07-30 MED ORDER — MIDAZOLAM HCL 2 MG/2ML IJ SOLN
1.0000 mg | INTRAMUSCULAR | Status: DC
  Administered 2020-07-30: 1 mg via INTRAVENOUS
  Filled 2020-07-30: qty 2

## 2020-07-30 MED ORDER — SODIUM CHLORIDE 0.9 % IV SOLN: INTRAVENOUS | Status: DC

## 2020-07-30 MED ORDER — DEXAMETHASONE SODIUM PHOSPHATE 10 MG/ML IJ SOLN
INTRAMUSCULAR | Status: AC
  Filled 2020-07-30: qty 1

## 2020-07-30 MED ORDER — VALSARTAN-HYDROCHLOROTHIAZIDE 320-25 MG PO TABS: 1.0000 | ORAL_TABLET | Freq: Every day | ORAL | Status: DC

## 2020-07-30 MED ORDER — METOCLOPRAMIDE HCL 5 MG PO TABS: 5.0000 mg | ORAL_TABLET | Freq: Three times a day (TID) | ORAL | Status: DC | PRN

## 2020-07-30 MED ORDER — METHOCARBAMOL 500 MG IVPB - SIMPLE MED
INTRAVENOUS | Status: AC
  Filled 2020-07-30: qty 50

## 2020-07-30 MED ORDER — FENTANYL CITRATE (PF) 100 MCG/2ML IJ SOLN
50.0000 ug | INTRAMUSCULAR | Status: DC
  Administered 2020-07-30: 50 ug via INTRAVENOUS
  Filled 2020-07-30: qty 2

## 2020-07-30 MED ORDER — ONDANSETRON HCL 4 MG/2ML IJ SOLN: 4.0000 mg | Freq: Four times a day (QID) | INTRAMUSCULAR | Status: DC | PRN

## 2020-07-30 MED ORDER — SODIUM CHLORIDE (PF) 0.9 % IJ SOLN
INTRAMUSCULAR | Status: AC
  Filled 2020-07-30: qty 30

## 2020-07-30 MED ORDER — ACETAMINOPHEN 325 MG PO TABS
325.0000 mg | ORAL_TABLET | Freq: Four times a day (QID) | ORAL | Status: DC | PRN
  Administered 2020-07-30: 500 mg via ORAL

## 2020-07-30 MED ORDER — DEXAMETHASONE SODIUM PHOSPHATE 10 MG/ML IJ SOLN
8.0000 mg | Freq: Once | INTRAMUSCULAR | Status: AC
  Administered 2020-07-30: 8 mg via INTRAVENOUS

## 2020-07-30 MED ORDER — VANCOMYCIN HCL IN DEXTROSE 1-5 GM/200ML-% IV SOLN
1000.0000 mg | Freq: Once | INTRAVENOUS | Status: AC
  Administered 2020-07-30: 1000 mg via INTRAVENOUS
  Filled 2020-07-30: qty 200

## 2020-07-30 MED ORDER — BISACODYL 10 MG RE SUPP: 10.0000 mg | Freq: Every day | RECTAL | Status: DC | PRN

## 2020-07-30 MED ORDER — FENTANYL CITRATE (PF) 100 MCG/2ML IJ SOLN
INTRAMUSCULAR | Status: AC
  Administered 2020-07-30: 50 ug via INTRAVENOUS
  Filled 2020-07-30: qty 2

## 2020-07-30 MED ORDER — BUPIVACAINE-EPINEPHRINE (PF) 0.25% -1:200000 IJ SOLN
INTRAMUSCULAR | Status: AC
  Filled 2020-07-30: qty 30

## 2020-07-30 MED ORDER — KETOROLAC TROMETHAMINE 30 MG/ML IJ SOLN
INTRAMUSCULAR | Status: AC
  Filled 2020-07-30: qty 1

## 2020-07-30 MED ORDER — LEVOTHYROXINE SODIUM 75 MCG PO TABS
75.0000 ug | ORAL_TABLET | Freq: Every day | ORAL | Status: DC
  Administered 2020-07-31: 75 ug via ORAL
  Filled 2020-07-30: qty 1

## 2020-07-30 MED ORDER — OXYCODONE HCL 5 MG PO TABS: 5.0000 mg | ORAL_TABLET | Freq: Once | ORAL | Status: DC | PRN

## 2020-07-30 MED ORDER — FERROUS SULFATE 325 (65 FE) MG PO TABS
325.0000 mg | ORAL_TABLET | Freq: Three times a day (TID) | ORAL | Status: DC
  Administered 2020-07-30 – 2020-07-31 (×2): 325 mg via ORAL
  Filled 2020-07-30 (×2): qty 1

## 2020-07-30 MED ORDER — ALBUTEROL SULFATE (2.5 MG/3ML) 0.083% IN NEBU: 3.0000 mL | INHALATION_SOLUTION | RESPIRATORY_TRACT | Status: DC | PRN

## 2020-07-30 MED ORDER — SODIUM CHLORIDE 0.9 % IR SOLN
Status: DC | PRN
  Administered 2020-07-30: 1000 mL

## 2020-07-30 MED ORDER — ACETAMINOPHEN 500 MG PO TABS
ORAL_TABLET | ORAL | Status: AC
  Administered 2020-07-30: 1000 mg
  Filled 2020-07-30: qty 2

## 2020-07-30 MED ORDER — LACTATED RINGERS IV SOLN: INTRAVENOUS | Status: DC

## 2020-07-30 MED ORDER — PROPOFOL 500 MG/50ML IV EMUL
INTRAVENOUS | Status: DC | PRN
  Administered 2020-07-30: 100 ug/kg/min via INTRAVENOUS

## 2020-07-30 MED ORDER — 0.9 % SODIUM CHLORIDE (POUR BTL) OPTIME
TOPICAL | Status: DC | PRN
  Administered 2020-07-30: 1000 mL

## 2020-07-30 MED ORDER — HYDROCODONE-ACETAMINOPHEN 7.5-325 MG PO TABS: 1.0000 | ORAL_TABLET | ORAL | Status: DC | PRN

## 2020-07-30 MED ORDER — ACETAMINOPHEN 500 MG PO TABS
1000.0000 mg | ORAL_TABLET | Freq: Once | ORAL | Status: AC
  Administered 2020-07-30: 1000 mg via ORAL
  Filled 2020-07-30: qty 2

## 2020-07-30 MED ORDER — METHOCARBAMOL 500 MG IVPB - SIMPLE MED
500.0000 mg | Freq: Four times a day (QID) | INTRAVENOUS | Status: DC | PRN
  Administered 2020-07-30: 500 mg via INTRAVENOUS
  Filled 2020-07-30: qty 50

## 2020-07-30 MED ORDER — ONDANSETRON HCL 4 MG/2ML IJ SOLN
INTRAMUSCULAR | Status: DC | PRN
  Administered 2020-07-30: 4 mg via INTRAVENOUS

## 2020-07-30 MED ORDER — STERILE WATER FOR IRRIGATION IR SOLN
Status: DC | PRN
  Administered 2020-07-30: 2000 mL

## 2020-07-30 MED ORDER — FENTANYL CITRATE (PF) 100 MCG/2ML IJ SOLN: 25.0000 ug | INTRAMUSCULAR | Status: DC | PRN

## 2020-07-30 SURGICAL SUPPLY — 55 items
ADH SKN CLS APL DERMABOND .7 (GAUZE/BANDAGES/DRESSINGS) ×1
ATTUNE MED ANAT PAT 35 KNEE (Knees) ×1 IMPLANT
ATTUNE PSFEM LTSZ5 NARCEM KNEE (Femur) ×2 IMPLANT
ATTUNE PSRP INSR SZ 5 10M KNEE (Insert) ×2 IMPLANT
BAG SPEC THK2 15X12 ZIP CLS (MISCELLANEOUS)
BAG ZIPLOCK 12X15 (MISCELLANEOUS) IMPLANT
BASEPLATE TIBIAL ROTATING SZ 4 (Knees) ×1 IMPLANT
BLADE SAW SGTL 11.0X1.19X90.0M (BLADE) IMPLANT
BLADE SAW SGTL 13.0X1.19X90.0M (BLADE) ×2 IMPLANT
BLADE SURG SZ10 CARB STEEL (BLADE) ×4 IMPLANT
BNDG ELASTIC 6X5.8 VLCR STR LF (GAUZE/BANDAGES/DRESSINGS) ×2 IMPLANT
BOWL SMART MIX CTS (DISPOSABLE) ×2 IMPLANT
BSPLAT TIB 4 CMNT ROT PLAT STR (Knees) ×1 IMPLANT
CEMENT HV SMART SET (Cement) ×2 IMPLANT
COVER WAND RF STERILE (DRAPES) IMPLANT
CUFF TOURN SGL QUICK 34 (TOURNIQUET CUFF) ×2
CUFF TRNQT CYL 34X4.125X (TOURNIQUET CUFF) ×1 IMPLANT
DECANTER SPIKE VIAL GLASS SM (MISCELLANEOUS) ×4 IMPLANT
DERMABOND ADVANCED (GAUZE/BANDAGES/DRESSINGS) ×1
DERMABOND ADVANCED .7 DNX12 (GAUZE/BANDAGES/DRESSINGS) ×1 IMPLANT
DRAPE U-SHAPE 47X51 STRL (DRAPES) ×2 IMPLANT
DRESSING AQUACEL AG SP 3.5X10 (GAUZE/BANDAGES/DRESSINGS) ×1 IMPLANT
DRSG AQUACEL AG ADV 3.5X10 (GAUZE/BANDAGES/DRESSINGS) ×1 IMPLANT
DRSG AQUACEL AG SP 3.5X10 (GAUZE/BANDAGES/DRESSINGS) ×2
DURAPREP 26ML APPLICATOR (WOUND CARE) ×4 IMPLANT
ELECT REM PT RETURN 15FT ADLT (MISCELLANEOUS) ×2 IMPLANT
GLOVE SURG ENC MOIS LTX SZ6 (GLOVE) IMPLANT
GLOVE SURG UNDER LTX SZ7.5 (GLOVE) ×2 IMPLANT
GLOVE SURG UNDER POLY LF SZ6.5 (GLOVE) IMPLANT
GLOVE SURG UNDER POLY LF SZ7.5 (GLOVE) ×2 IMPLANT
GOWN STRL REUS W/TWL LRG LVL3 (GOWN DISPOSABLE) ×2 IMPLANT
HANDPIECE INTERPULSE COAX TIP (DISPOSABLE) ×2
HOLDER FOLEY CATH W/STRAP (MISCELLANEOUS) IMPLANT
KIT TURNOVER KIT A (KITS) ×2 IMPLANT
MANIFOLD NEPTUNE II (INSTRUMENTS) ×2 IMPLANT
NDL SAFETY ECLIPSE 18X1.5 (NEEDLE) IMPLANT
NEEDLE HYPO 18GX1.5 SHARP (NEEDLE)
NS IRRIG 1000ML POUR BTL (IV SOLUTION) ×2 IMPLANT
PACK TOTAL KNEE CUSTOM (KITS) ×2 IMPLANT
PENCIL SMOKE EVACUATOR (MISCELLANEOUS) IMPLANT
PIN DRILL FIX HALF THREAD (BIT) ×1 IMPLANT
PIN STEINMAN FIXATION KNEE (PIN) ×1 IMPLANT
PROTECTOR NERVE ULNAR (MISCELLANEOUS) ×2 IMPLANT
SET HNDPC FAN SPRY TIP SCT (DISPOSABLE) ×1 IMPLANT
SET PAD KNEE POSITIONER (MISCELLANEOUS) ×2 IMPLANT
SUT MNCRL AB 4-0 PS2 18 (SUTURE) ×2 IMPLANT
SUT STRATAFIX PDS+ 0 24IN (SUTURE) ×2 IMPLANT
SUT VIC AB 1 CT1 36 (SUTURE) ×2 IMPLANT
SUT VIC AB 2-0 CT1 27 (SUTURE) ×6
SUT VIC AB 2-0 CT1 TAPERPNT 27 (SUTURE) ×3 IMPLANT
SYR 3ML LL SCALE MARK (SYRINGE) ×2 IMPLANT
TRAY FOLEY MTR SLVR 16FR STAT (SET/KITS/TRAYS/PACK) ×2 IMPLANT
TUBE SUCTION HIGH CAP CLEAR NV (SUCTIONS) ×2 IMPLANT
WATER STERILE IRR 1000ML POUR (IV SOLUTION) ×4 IMPLANT
WRAP KNEE MAXI GEL POST OP (GAUZE/BANDAGES/DRESSINGS) ×2 IMPLANT

## 2020-07-30 NOTE — Anesthesia Procedure Notes (Signed)
Spinal  Patient location during procedure: OR Start time: 07/30/2020 10:24 AM End time: 07/30/2020 10:29 AM Reason for block: surgical anesthesia Staffing Performed: resident/CRNA  Resident/CRNA: West Pugh, CRNA Preanesthetic Checklist Completed: patient identified, IV checked, site marked, risks and benefits discussed, surgical consent, monitors and equipment checked, pre-op evaluation and timeout performed Spinal Block Patient position: sitting Prep: DuraPrep and site prepped and draped Patient monitoring: heart rate, continuous pulse ox and blood pressure Approach: midline Location: L3-4 Injection technique: single-shot Needle Needle type: Pencan and Introducer  Needle gauge: 24 G Needle length: 10 cm Assessment Sensory level: T4 Events: CSF return Additional Notes IV functioning, monitors applied to pt. Expiration date of kit checked and confirmed to be in date. Sterile prep and drape, hand hygiene and sterile gloved used. Pt was positioned and spine was prepped in sterile fashion. Skin was anesthetized with lidocaine. Free flow of clear CSF obtained prior to injecting local anesthetic into CSF x 1 attempt. Spinal needle aspirated freely following injection. Needle was carefully withdrawn, and pt tolerated procedure well. Loss of motor and sensory on exam post injection.

## 2020-07-30 NOTE — Anesthesia Postprocedure Evaluation (Signed)
Anesthesia Post Note  Patient: Melissa Collier  Procedure(s) Performed: TOTAL KNEE ARTHROPLASTY (Left Knee)     Patient location during evaluation: Nursing Unit Anesthesia Type: Regional and Spinal Level of consciousness: oriented and awake and alert Pain management: pain level controlled Vital Signs Assessment: post-procedure vital signs reviewed and stable Respiratory status: spontaneous breathing, respiratory function stable and patient connected to nasal cannula oxygen Cardiovascular status: blood pressure returned to baseline and stable Postop Assessment: no headache, no backache, no apparent nausea or vomiting and patient able to bend at knees Anesthetic complications: no   No complications documented.  Last Vitals:  Vitals:   07/30/20 1315 07/30/20 1338  BP: 125/63 132/70  Pulse: 75 70  Resp: 17 16  Temp: 36.6 C 36.6 C  SpO2: 96% 96%    Last Pain:  Vitals:   07/30/20 1341  TempSrc:   PainSc: 5                  Candra R Ripley Lovecchio

## 2020-07-30 NOTE — Evaluation (Signed)
Physical Therapy Evaluation Patient Details Name: Melissa Collier MRN: [id number] DOB: [birth year] Today's Date: 07/30/2020   History of Present Illness  70 yo female s/p L TKA 07/30/20. Hx of R TKA 2020  Clinical Impression  On eval POD 0, pt was MIn guard assist for mobility. She walked ~50 feet with a RW. Moderate pain with activity. Pt also experiencing L LE sciatica at this time. Repositioned in recliner at end of session-pt to sit up as tolerated. Will continue to follow.     Follow Up Recommendations Follow surgeon's recommendation for DC plan and follow-up therapies    Equipment Recommendations  Rolling walker with 5" wheels    Recommendations for Other Services       Precautions / Restrictions Precautions Precautions: Fall;Knee Precaution Comments: sciatica L side Restrictions Weight Bearing Restrictions: No Other Position/Activity Restrictions: WBAT      Mobility  Bed Mobility Overal bed mobility: Needs Assistance Bed Mobility: Supine to Sit     Supine to sit: Supervision;HOB elevated     General bed mobility comments: Supv for safety.    Transfers Overall transfer level: Needs assistance Equipment used: Rolling walker (2 wheeled) Transfers: Sit to/from Stand Sit to Stand: Min guard         General transfer comment: Min guard for safety. Cues for safety, technique, hand/LE placement.  Ambulation/Gait Ambulation/Gait assistance: Min guard Gait Distance (Feet): 50 Feet Assistive device: Rolling walker (2 wheeled) Gait Pattern/deviations: Step-to pattern;Antalgic;Decreased weight shift to left     General Gait Details: Cues for safety, technique, sequence. Encouraged pt to put weight through L LE instead of hopping. Slow but steady.  Stairs            Wheelchair Mobility    Modified Rankin (Stroke Patients Only)       Balance Overall balance assessment: Needs assistance         Standing balance support: Bilateral upper  extremity supported Standing balance-Leahy Scale: Poor                               Pertinent Vitals/Pain Pain Assessment: 0-10 Pain Score: 7  Pain Location: L LE Pain Descriptors / Indicators: Radiating;Discomfort Pain Intervention(s): Monitored during session;Repositioned    Home Living Family/patient expects to be discharged to:: Private residence   Available Help at Discharge: Family Type of Home: House Home Access: Stairs to enter Entrance Stairs-Rails: None Entrance Stairs-Number of Steps: 3 Home Layout: One level Home Equipment: Walker - standard;Cane - single point;Bedside commode      Prior Function Level of Independence: Independent with assistive device(s)         Comments: using cane     Hand Dominance        Extremity/Trunk Assessment   Upper Extremity Assessment Upper Extremity Assessment: Overall WFL for tasks assessed    Lower Extremity Assessment Lower Extremity Assessment: Generalized weakness    Cervical / Trunk Assessment Cervical / Trunk Assessment: Normal  Communication   Communication: No difficulties  Cognition Arousal/Alertness: Awake/alert Behavior During Therapy: WFL for tasks assessed/performed Overall Cognitive Status: Within Functional Limits for tasks assessed                                        General Comments      Exercises     Assessment/Plan    PT Assessment Patient  needs continued PT services  PT Problem List Decreased strength;Decreased mobility;Decreased range of motion;Decreased activity tolerance;Decreased balance;Decreased knowledge of use of DME;Pain       PT Treatment Interventions DME instruction;Gait training;Therapeutic exercise;Balance training;Stair training;Functional mobility training;Patient/family education;Therapeutic activities    PT Goals (Current goals can be found in the Care Plan section)  Acute Rehab PT Goals Patient Stated Goal: less pain. PT Goal  Formulation: With patient Time For Goal Achievement: 08/13/20 Potential to Achieve Goals: Good    Frequency 7X/week   Barriers to discharge        Co-evaluation               AM-PAC PT "6 Clicks" Mobility  Outcome Measure Help needed turning from your back to your side while in a flat bed without using bedrails?: A Little Help needed moving from lying on your back to sitting on the side of a flat bed without using bedrails?: A Little Help needed moving to and from a bed to a chair (including a wheelchair)?: A Little Help needed standing up from a chair using your arms (e.g., wheelchair or bedside chair)?: A Little Help needed to walk in hospital room?: A Little Help needed climbing 3-5 steps with a railing? : A Little 6 Click Score: 18    End of Session Equipment Utilized During Treatment: Gait belt Activity Tolerance: Patient tolerated treatment well Patient left: in chair;with call bell/phone within reach;with family/visitor present   PT Visit Diagnosis: Other abnormalities of gait and mobility (R26.89);Pain Pain - Right/Left: Left Pain - part of body: Leg;Knee    Time: 1700-1749 PT Time Calculation (min) (ACUTE ONLY): 23 min   Charges:   PT Evaluation $PT Eval Low Complexity: 1 Low PT Treatments $Gait Training: 8-22 mins           Faye Ramsay, PT Acute Rehabilitation  Office: [phone] Pager: [phone]

## 2020-07-30 NOTE — Anesthesia Procedure Notes (Signed)
Anesthesia Regional Block: Adductor canal block   Pre-Anesthetic Checklist: ,, timeout performed, Correct Patient, Correct Site, Correct Laterality, Correct Procedure, Correct Position, site marked, Risks and benefits discussed,  Surgical consent,  Pre-op evaluation,  At surgeon's request and post-op pain management  Laterality: Left  Prep: chloraprep       Needles:  Injection technique: Single-shot  Needle Type: Echogenic Stimulator Needle     Needle Length: 10cm  Needle Gauge: 20     Additional Needles:   Narrative:  Start time: 07/30/2020 9:05 AM End time: 07/30/2020 9:10 AM Injection made incrementally with aspirations every 5 mL.  Performed by: Personally  Anesthesiologist: Mellody Dance, MD  Additional Notes: A functioning IV was confirmed and monitors were applied.  Sterile prep and drape, hand hygiene and sterile gloves were used.  Negative aspiration and test dose prior to incremental administration of local anesthetic. The patient tolerated the procedure well.Ultrasound  guidance: relevant anatomy identified, needle position confirmed, local anesthetic spread visualized around nerve(s), vascular puncture avoided.  Image printed for medical record.

## 2020-07-30 NOTE — Anesthesia Preprocedure Evaluation (Addendum)
Anesthesia Evaluation  Patient identified by MRN, date of birth, ID band Patient awake    Reviewed: Allergy & Precautions, NPO status , Patient's Chart, lab work & pertinent test results  Airway Mallampati: II  TM Distance: >3 FB Neck ROM: Full    Dental no notable dental hx.    Pulmonary asthma , sleep apnea ,    Pulmonary exam normal breath sounds clear to auscultation       Cardiovascular Exercise Tolerance: Good hypertension, Normal cardiovascular exam Rhythm:Regular Rate:Normal     Neuro/Psych  Neuromuscular disease (sciatica) negative psych ROS   GI/Hepatic Neg liver ROS, hiatal hernia, GERD  ,  Endo/Other  Hypothyroidism   Renal/GU negative Renal ROS  negative genitourinary   Musculoskeletal  (+) Arthritis ,   Abdominal   Peds negative pediatric ROS (+)  Hematology negative hematology ROS (+)   Anesthesia Other Findings   Reproductive/Obstetrics                            Anesthesia Physical Anesthesia Plan  ASA: II  Anesthesia Plan: Regional, MAC and Spinal   Post-op Pain Management:    Induction: Intravenous  PONV Risk Score and Plan: 2 and Propofol infusion, TIVA, Treatment may vary due to age or medical condition, Midazolam and Ondansetron  Airway Management Planned: Simple Face Mask and Natural Airway  Additional Equipment:   Intra-op Plan:   Post-operative Plan:   Informed Consent: I have reviewed the patients History and Physical, chart, labs and discussed the procedure including the risks, benefits and alternatives for the proposed anesthesia with the patient or authorized representative who has indicated his/her understanding and acceptance.       Plan Discussed with: CRNA and Anesthesiologist  Anesthesia Plan Comments: (Adductor block. Spinal. GA/LMA as backup . Norton Blizzard, MD  )       Anesthesia Quick Evaluation

## 2020-07-30 NOTE — Discharge Instructions (Signed)

## 2020-07-30 NOTE — Anesthesia Procedure Notes (Addendum)
Procedure Name: MAC Date/Time: 07/30/2020 10:24 AM Performed by: West Pugh, CRNA Pre-anesthesia Checklist: Patient identified, Emergency Drugs available, Suction available, Patient being monitored and Timeout performed Patient Re-evaluated:Patient Re-evaluated prior to induction Oxygen Delivery Method: Simple face mask Preoxygenation: Pre-oxygenation with 100% oxygen Placement Confirmation: positive ETCO2 Dental Injury: Teeth and Oropharynx as per pre-operative assessment

## 2020-07-30 NOTE — Transfer of Care (Signed)
Immediate Anesthesia Transfer of Care Note  Patient: Melissa Collier  Procedure(s) Performed: TOTAL KNEE ARTHROPLASTY (Left Knee)  Patient Location: PACU  Anesthesia Type:Spinal and MAC combined with regional for post-op pain  Level of Consciousness: awake, alert , oriented and patient cooperative  Airway & Oxygen Therapy: Patient Spontanous Breathing and Patient connected to face mask oxygen  Post-op Assessment: Report given to RN and Post -op Vital signs reviewed and stable  Post vital signs: Reviewed and stable  Last Vitals:  Vitals Value Taken Time  BP 118/67 07/30/20 1215  Temp    Pulse 69 07/30/20 1219  Resp 18 07/30/20 1219  SpO2 100 % 07/30/20 1219  Vitals shown include unvalidated device data.  Last Pain:  Vitals:   07/30/20 0809  TempSrc: Oral  PainSc:          Complications: No complications documented.

## 2020-07-30 NOTE — Interval H&P Note (Signed)
History and Physical Interval Note:  07/30/2020 8:50 AM  Melissa Collier  has presented today for surgery, with the diagnosis of Left knee osteoarthritis.  The various methods of treatment have been discussed with the patient and family. After consideration of risks, benefits and other options for treatment, the patient has consented to  Procedure(s) with comments: TOTAL KNEE ARTHROPLASTY (Left) - 70 mins as a surgical intervention.  The patient's history has been reviewed, patient examined, no change in status, stable for surgery.  I have reviewed the patient's chart and labs.  Questions were answered to the patient's satisfaction.     Shelda Pal

## 2020-07-30 NOTE — Progress Notes (Signed)
AssistedDr. Bass with left, ultrasound guided, adductor canal block. Side rails up, monitors on throughout procedure. See vital signs in flow sheet. Tolerated Procedure well.  

## 2020-07-30 NOTE — Op Note (Signed)
NAME:  Melissa Collier                      MEDICAL RECORD NO.:  [id number]                             FACILITY:  [facility]      PHYSICIAN:  Madlyn Frankel. Charlann Boxer, M.D.  DATE OF BIRTH:  1950/10/09      DATE OF PROCEDURE:  07/30/2020                                     OPERATIVE REPORT         PREOPERATIVE DIAGNOSIS:  Left knee osteoarthritis.      POSTOPERATIVE DIAGNOSIS:  Left knee osteoarthritis.      FINDINGS:  The patient was noted to have complete loss of cartilage and   bone-on-bone arthritis with associated osteophytes in the medial and patellofemoral compartments of   the knee with a significant synovitis and associated effusion.  The patient had failed months of conservative treatment including medications, injection therapy, activity modification.     PROCEDURE:  Left total knee replacement.      COMPONENTS USED:  DePuy Attune rotating platform posterior stabilized knee   system, a size 5N femur, 4 tibia, size 10 mm PS AOX insert, and 35 anatomic patellar   button.      SURGEON:  Madlyn Frankel. Charlann Boxer, M.D.      ASSISTANT:  Rosalene Billings, PA-C.      ANESTHESIA:  Regional and Spinal.      SPECIMENS:  None.      COMPLICATION:  None.      DRAINS:  None.  EBL: <100 cc      TOURNIQUET TIME:   Total Tourniquet Time Documented: Thigh (Left) - 34 minutes Total: Thigh (Left) - 34 minutes  .      The patient was stable to the recovery room.      INDICATION FOR PROCEDURE:  Melissa Collier is a 70 y.o. female patient of   mine.  The patient had been seen, evaluated, and treated for months conservatively in the   office with medication, activity modification, and injections.  The patient had   radiographic changes of bone-on-bone arthritis with endplate sclerosis and osteophytes noted.  Based on the radiographic changes and failed conservative measures, the patient   decided to proceed with definitive treatment, total knee replacement.  Risks of infection, DVT, component  failure, need for revision surgery, neurovascular injury were reviewed in the office setting.  The postop course was reviewed stressing the efforts to maximize post-operative satisfaction and function.  Consent was obtained for benefit of pain   relief.      PROCEDURE IN DETAIL:  The patient was brought to the operative theater.   Once adequate anesthesia, preoperative antibiotics, 2 gm of Ancef,1 gm of Tranexamic Acid, and 10 mg of Decadron administered, the patient was positioned supine with a left thigh tourniquet placed.  The  left lower extremity was prepped and draped in sterile fashion.  A time-   out was performed identifying the patient, planned procedure, and the appropriate extremity.      The left lower extremity was placed in the [facility] leg holder.  The leg was   exsanguinated, tourniquet elevated to 250 mmHg.  A midline incision was  made followed by median parapatellar arthrotomy.  Following initial   exposure, attention was first directed to the patella.  Precut   measurement was noted to be 22 mm.  I resected down to 14 mm and used a   35 anatomic patellar button to restore patellar height as well as cover the cut surface.      The lug holes were drilled and a metal shim was placed to protect the   patella from retractors and saw blade during the procedure.      At this point, attention was now directed to the femur.  The femoral   canal was opened with a drill, irrigated to try to prevent fat emboli.  An   intramedullary rod was passed at 3 degrees valgus, 11 mm of bone was   resected off the distal femur due to pre-operative flexion contracture.  Following this resection, the tibia was   subluxated anteriorly.  Using the extramedullary guide, 2 mm of bone was resected off   the proximal medial tibia.  We confirmed the gap would be   stable medially and laterally with a size 6 spacer block as well as confirmed that the tibial cut was perpendicular in the coronal plane,  checking with an alignment rod.      Once this was done, I sized the femur to be a size 5 in the anterior-   posterior dimension, chose a narrow component based on medial and   lateral dimension.  The size 5 rotation block was then pinned in   position anterior referenced using the C-clamp to set rotation.  The   anterior, posterior, and  chamfer cuts were made without difficulty nor   notching making certain that I was along the anterior cortex to help   with flexion gap stability.      The final box cut was made off the lateral aspect of distal femur.      At this point, the tibia was sized to be a size 4.  The size 4 tray was   then pinned in position through the medial third of the tubercle,   drilled, and keel punched.  Trial reduction was now carried with a 5 femur,  4 tibia, a size 8 up to the 10 mm PS insert, and the 35 anatomic patella botton.  The knee was brought to full extension with good flexion stability with the patella   tracking through the trochlea without application of pressure.  Given   all these findings the trial components removed.  Final components were   opened and cement was mixed.  The knee was irrigated with normal saline solution and pulse lavage.  The synovial lining was   then injected with 30 cc of 0.25% Marcaine with epinephrine, 1 cc of Toradol and 30 cc of NS for a total of 61 cc.     Final implants were then cemented onto cleaned and dried cut surfaces of bone with the knee brought to extension with a size 10 mm PS trial insert.      Once the cement had fully cured, excess cement was removed   throughout the knee.  I confirmed that I was satisfied with the range of   motion and stability, and the final size 10 mm PS AOX insert was chosen.  It was   placed into the knee.      The tourniquet had been let down at 34 minutes.  No significant   hemostasis was required.  The extensor mechanism was then reapproximated using #1 Vicryl and #1 Stratafix  sutures with the knee   in flexion.  The   remaining wound was closed with 2-0 Vicryl and running 4-0 Monocryl.   The knee was cleaned, dried, dressed sterilely using Dermabond and   Aquacel dressing.  The patient was then   brought to recovery room in stable condition, tolerating the procedure   well.   Please note that Physician Assistant, Rosalene Billings, PA-C was present for the entirety of the case, and was utilized for pre-operative positioning, peri-operative retractor management, general facilitation of the procedure and for primary wound closure at the end of the case.              Madlyn Frankel Charlann Boxer, M.D.    07/30/2020 11:58 AM

## 2020-07-31 ENCOUNTER — Encounter (HOSPITAL_COMMUNITY): Payer: Self-pay | Admitting: Orthopedic Surgery

## 2020-07-31 DIAGNOSIS — J45909 Unspecified asthma, uncomplicated: Secondary | ICD-10-CM | POA: Diagnosis not present

## 2020-07-31 DIAGNOSIS — E039 Hypothyroidism, unspecified: Secondary | ICD-10-CM | POA: Diagnosis not present

## 2020-07-31 DIAGNOSIS — Z79899 Other long term (current) drug therapy: Secondary | ICD-10-CM | POA: Diagnosis not present

## 2020-07-31 DIAGNOSIS — Z96651 Presence of right artificial knee joint: Secondary | ICD-10-CM | POA: Diagnosis not present

## 2020-07-31 DIAGNOSIS — M1712 Unilateral primary osteoarthritis, left knee: Secondary | ICD-10-CM | POA: Diagnosis not present

## 2020-07-31 DIAGNOSIS — I10 Essential (primary) hypertension: Secondary | ICD-10-CM | POA: Diagnosis not present

## 2020-07-31 LAB — CBC
HCT: 31.8 % — ABNORMAL LOW (ref 36.0–46.0)
Hemoglobin: 10.6 g/dL — ABNORMAL LOW (ref 12.0–15.0)
MCH: 32.4 pg (ref 26.0–34.0)
MCHC: 33.3 g/dL (ref 30.0–36.0)
MCV: 97.2 fL (ref 80.0–100.0)
Platelets: 201 10*3/uL (ref 150–400)
RBC: 3.27 MIL/uL — ABNORMAL LOW (ref 3.87–5.11)
RDW: 12.3 % (ref 11.5–15.5)
WBC: 8.6 10*3/uL (ref 4.0–10.5)
nRBC: 0 % (ref 0.0–0.2)

## 2020-07-31 LAB — BASIC METABOLIC PANEL
Anion gap: 7 (ref 5–15)
BUN: 14 mg/dL (ref 8–23)
CO2: 26 mmol/L (ref 22–32)
Calcium: 8.8 mg/dL — ABNORMAL LOW (ref 8.9–10.3)
Chloride: 103 mmol/L (ref 98–111)
Creatinine, Ser: 0.73 mg/dL (ref 0.44–1.00)
GFR, Estimated: >60 mL/min (ref >60)
Glucose, Bld: 130 mg/dL — ABNORMAL HIGH (ref 70–99)
Potassium: 3.9 mmol/L (ref 3.5–5.1)
Sodium: 136 mmol/L (ref 135–145)

## 2020-07-31 MED ORDER — POLYETHYLENE GLYCOL 3350 17 G PO PACK: 17.0000 g | PACK | Freq: Every day | ORAL | 0 refills | Status: DC | PRN

## 2020-07-31 MED ORDER — CELECOXIB 200 MG PO CAPS: 200.0000 mg | ORAL_CAPSULE | Freq: Two times a day (BID) | ORAL | 0 refills | Status: AC

## 2020-07-31 MED ORDER — METHOCARBAMOL 500 MG PO TABS: 500.0000 mg | ORAL_TABLET | Freq: Four times a day (QID) | ORAL | 0 refills | Status: DC | PRN

## 2020-07-31 MED ORDER — DOCUSATE SODIUM 100 MG PO CAPS: 100.0000 mg | ORAL_CAPSULE | Freq: Two times a day (BID) | ORAL | 0 refills | Status: DC

## 2020-07-31 MED ORDER — HYDROCODONE-ACETAMINOPHEN 5-325 MG PO TABS: 1.0000 | ORAL_TABLET | Freq: Four times a day (QID) | ORAL | 0 refills | Status: DC | PRN

## 2020-07-31 MED ORDER — ASPIRIN 81 MG PO CHEW: 81.0000 mg | CHEWABLE_TABLET | Freq: Two times a day (BID) | ORAL | 0 refills | Status: AC

## 2020-07-31 NOTE — TOC Transition Note (Signed)
Transition of Care [facility]) - CM/SW Discharge Note   Patient Details  Name: [name] MRN: [id number] Date of Birth: February 19, 1951  Transition of [facility]) CM/SW Contact:  Lennart Pall, LCSW Phone Number: 07/31/2020, 11:06 AM   Clinical Narrative:    Met briefly with pt and confirming she has needed DME at home.  Plan for OPPT at [facility] in [place].  No TOC needs.   Final next level of care: OP Rehab Barriers to Discharge: No Barriers Identified   Patient Goals and CMS Choice Patient states their goals for this hospitalization and ongoing recovery are:: return home      Discharge Placement                       Discharge Plan and Services                DME Arranged: N/A DME Agency: NA                  Social Determinants of Health (SDOH) Interventions     Readmission Risk Interventions No flowsheet data found.

## 2020-07-31 NOTE — Progress Notes (Signed)
Physical Therapy Treatment Patient Details Name: Melissa Collier MRN: [id number] DOB: 02/01/1951 Today's Date: 07/31/2020    History of Present Illness 70 yo female s/p L TKA 07/30/20. Hx of R TKA 2020    PT Comments    Progressing well with mobility. Reviewed exercises, gait training, and stair training. All education completed. Okay to d/c from PT standpoint.    Follow Up Recommendations  Follow surgeon's recommendation for DC plan and follow-up therapies     Equipment Recommendations  Rolling walker with 5" wheels (insurance wouldn't cover new rolling walker-recommended pt/family go to DME store to see if they can purchase wheels for current walker)    Recommendations for Other Services       Precautions / Restrictions Precautions Precautions: Fall;Knee Precaution Comments: sciatica L side Restrictions Weight Bearing Restrictions: No LLE Weight Bearing: Weight bearing as tolerated    Mobility  Bed Mobility               General bed mobility comments: oob in recliner    Transfers Overall transfer level: Needs assistance Equipment used: Rolling walker (2 wheeled) Transfers: Sit to/from Stand Sit to Stand: Supervision         General transfer comment: Supv for safety.  Ambulation/Gait Ambulation/Gait assistance: Supervision Gait Distance (Feet): 125 Feet Assistive device: Rolling walker (2 wheeled) Gait Pattern/deviations: Step-to pattern;Step-through pattern;Decreased stride length     General Gait Details: Increased knee flexion in stance phase on L LE-cues for knee extension and heel down.   Stairs Stairs: Yes Stairs assistance: Min assist Stair Management: Step to pattern;Forwards;With walker Number of Stairs: 2 General stair comments: Cues for safety, technique, sequence. Assist to stabilize RW only. Pt practiced x 1 with therapist and x 1 with husband.   Wheelchair Mobility    Modified Rankin (Stroke Patients Only)       Balance  Overall balance assessment: Needs assistance         Standing balance support: Bilateral upper extremity supported Standing balance-Leahy Scale: Fair                              Cognition Arousal/Alertness: Awake/alert Behavior During Therapy: WFL for tasks assessed/performed Overall Cognitive Status: Within Functional Limits for tasks assessed                                        Exercises Total Joint Exercises Ankle Circles/Pumps: AROM;Both;10 reps Quad Sets: AROM;Both;10 reps Heel Slides: AROM;Left;10 reps Hip ABduction/ADduction: AROM;Left;10 reps Straight Leg Raises: AROM;Left;5 reps;Supine (limited reps so as to not flare up sciatica) Goniometric ROM: ~10-65 degrees    General Comments        Pertinent Vitals/Pain Pain Assessment: 0-10 Pain Score: 5  Pain Location: L LE Pain Descriptors / Indicators: Discomfort;Sore Pain Intervention(s): Monitored during session;Ice applied    Home Living                      Prior Function            PT Goals (current goals can now be found in the care plan section) Progress towards PT goals: Progressing toward goals    Frequency    7X/week      PT Plan Current plan remains appropriate    Co-evaluation  AM-PAC PT "6 Clicks" Mobility   Outcome Measure  Help needed turning from your back to your side while in a flat bed without using bedrails?: A Little Help needed moving from lying on your back to sitting on the side of a flat bed without using bedrails?: A Little Help needed moving to and from a bed to a chair (including a wheelchair)?: A Little Help needed standing up from a chair using your arms (e.g., wheelchair or bedside chair)?: A Little Help needed to walk in hospital room?: A Little Help needed climbing 3-5 steps with a railing? : A Little 6 Click Score: 18    End of Session Equipment Utilized During Treatment: Gait belt Activity Tolerance:  Patient tolerated treatment well Patient left: in chair;with call bell/phone within reach;with family/visitor present   PT Visit Diagnosis: Other abnormalities of gait and mobility (R26.89) Pain - Right/Left: Left Pain - part of body: Knee     Time: 8003-4917 PT Time Calculation (min) (ACUTE ONLY): 20 min  Charges:  $Gait Training: 8-22 mins                        Faye Ramsay, PT Acute Rehabilitation  Office: [phone] Pager: [phone]

## 2020-07-31 NOTE — Progress Notes (Signed)
Patient ID: Melissa Collier, female   DOB: 20-Aug-1950, 70 y.o.   MRN: [id number] Subjective: 1 Day Post-Op Procedure(s) (LRB): TOTAL KNEE ARTHROPLASTY (Left)    Patient reports pain as mild. Doing very well. No events overnight.  Objective:   VITALS:   Vitals:   07/31/20 0054 07/31/20 0500  BP: 113/73 136/79  Pulse: 72 65  Resp: 18 17  Temp: 97.9 F (36.6 C) 98 F (36.7 C)  SpO2: 94% 97%    Neurovascular intact Incision: dressing C/D/I left knee  LABS Recent Labs    07/31/20 0314  HGB 10.6*  HCT 31.8*  WBC 8.6  PLT 201    Recent Labs    07/31/20 0314  NA 136  K 3.9  BUN 14  CREATININE 0.73  GLUCOSE 130*    No results for input(s): LABPT, INR in the last 72 hours.   Assessment/Plan: 1 Day Post-Op Procedure(s) (LRB): TOTAL KNEE ARTHROPLASTY (Left)   Up with therapy  Home today after therapy likely in am RTC in 2 weeks She knows what to do based on her experience with her right knee

## 2020-08-02 DIAGNOSIS — M6281 Muscle weakness (generalized): Secondary | ICD-10-CM | POA: Diagnosis not present

## 2020-08-02 DIAGNOSIS — M25662 Stiffness of left knee, not elsewhere classified: Secondary | ICD-10-CM | POA: Diagnosis not present

## 2020-08-02 DIAGNOSIS — M25562 Pain in left knee: Secondary | ICD-10-CM | POA: Diagnosis not present

## 2020-08-05 DIAGNOSIS — M25662 Stiffness of left knee, not elsewhere classified: Secondary | ICD-10-CM | POA: Diagnosis not present

## 2020-08-05 DIAGNOSIS — M25562 Pain in left knee: Secondary | ICD-10-CM | POA: Diagnosis not present

## 2020-08-05 DIAGNOSIS — M6281 Muscle weakness (generalized): Secondary | ICD-10-CM | POA: Diagnosis not present

## 2020-08-07 DIAGNOSIS — M6281 Muscle weakness (generalized): Secondary | ICD-10-CM | POA: Diagnosis not present

## 2020-08-07 DIAGNOSIS — M25662 Stiffness of left knee, not elsewhere classified: Secondary | ICD-10-CM | POA: Diagnosis not present

## 2020-08-07 DIAGNOSIS — M25562 Pain in left knee: Secondary | ICD-10-CM | POA: Diagnosis not present

## 2020-08-11 NOTE — Discharge Summary (Signed)
Physician Discharge Summary   Patient ID: Melissa Collier MRN: [id number] DOB/AGE: 1950-07-17 70 y.o.  Admit date: 07/30/2020 Discharge date: 07/31/2020  Primary Diagnosis: Left knee osteoarthritis  Admission Diagnoses:  Past Medical History:  Diagnosis Date   Arthritis    Asthma    GERD (gastroesophageal reflux disease)    Hashimoto's thyroiditis    History of hiatal hernia    Hyperlipidemia    Hypertension    Hypothyroidism    Multinodular goiter    Osteopenia    Pneumonia    History of   Sciatica    Left greater than right   Sleep apnea    Mild, NO CPAP   Discharge Diagnoses:   Active Problems:   S/P total knee arthroplasty, left  Estimated body mass index is 26.55 kg/m as calculated from the following:   Height as of this encounter: 5' 5.5" (1.664 m).   Weight as of this encounter: 73.5 kg.  Procedure:  Procedure(s) (LRB): TOTAL KNEE ARTHROPLASTY (Left)   Consults: None  HPI:  Selina Tapper Coulthard is a 70 y.o. female patient of  mine.  The patient had been seen, evaluated, and treated for months conservatively in the  office with medication, activity modification, and injections.  The patient had  radiographic changes of bone-on-bone arthritis with endplate sclerosis and osteophytes noted.  Based on the radiographic changes and failed conservative measures, the patient  decided to proceed with definitive treatment, total knee replacement.  Risks of infection, DVT, component failure, need for revision surgery, neurovascular injury were reviewed in the office setting.  The postop course was reviewed stressing the efforts to maximize post-operative satisfaction and function.  Consent was obtained for benefit of pain  relief.  Laboratory Data: Admission on 07/30/2020, Discharged on 07/31/2020  Component Date Value Ref Range Status   WBC 07/31/2020 8.6  4.0 - 10.5 K/uL Final   RBC 07/31/2020 3.27 (A) 3.87 - 5.11 MIL/uL Final   Hemoglobin 07/31/2020 10.6 (A)  12.0 - 15.0 g/dL Final   HCT 35/32/9924 31.8 (A) 36.0 - 46.0 % Final   MCV 07/31/2020 97.2  80.0 - 100.0 fL Final   MCH 07/31/2020 32.4  26.0 - 34.0 pg Final   MCHC 07/31/2020 33.3  30.0 - 36.0 g/dL Final   RDW 26/83/4196 12.3  11.5 - 15.5 % Final   Platelets 07/31/2020 201  150 - 400 K/uL Final   nRBC 07/31/2020 0.0  0.0 - 0.2 % Final   Performed at [facility], [address] [place], [place] [address]   Sodium 07/31/2020 136  135 - 145 mmol/L Final   Potassium 07/31/2020 3.9  3.5 - 5.1 mmol/L Final   Chloride 07/31/2020 103  98 - 111 mmol/L Final   CO2 07/31/2020 26  22 - 32 mmol/L Final   Glucose, Bld 07/31/2020 130 (A) 70 - 99 mg/dL Final   Glucose reference range applies only to samples taken after fasting for at least 8 hours.   BUN 07/31/2020 14  8 - 23 mg/dL Final   Creatinine, Ser 07/31/2020 0.73  0.44 - 1.00 mg/dL Final   Calcium 98/92/1194 8.8 (A) 8.9 - 10.3 mg/dL Final   GFR, Estimated 07/31/2020 >60  >60 mL/min Final   Comment: (NOTE) Calculated using the CKD-EPI Creatinine Equation (2021)    Anion gap 07/31/2020 7  5 - 15 Final   Performed at [facility], [address], [place], [place] [address]  Hospital Outpatient Visit on 07/26/2020  Component  Date Value Ref Range Status   SARS Coronavirus 2 07/26/2020 NEGATIVE  NEGATIVE Final   Comment: (NOTE) SARS-CoV-2 target nucleic acids are NOT DETECTED.  The SARS-CoV-2 RNA is generally detectable in upper and lower respiratory specimens during the acute phase of infection. Negative results do not preclude SARS-CoV-2 infection, do not rule out co-infections with other pathogens, and should not be used as the sole basis for treatment or other patient management decisions. Negative results must be combined with clinical observations, patient history, and epidemiological information. The expected result is Negative.  Fact Sheet for  Patients: HairSlick.nohttps://www.fda.gov/media/138098/download  Fact Sheet for Healthcare Providers: quierodirigir.comhttps://www.fda.gov/media/138095/download  This test is not yet approved or cleared by the [place] FDA and  has been authorized for detection and/or diagnosis of SARS-CoV-2 by FDA under an Emergency Use Authorization (EUA). This EUA will remain  in effect (meaning this test can be used) for the duration of the COVID-19 declaration under Se                          ction 564(b)(1) of the Act, 21 U.S.C. section 360bbb-3(b)(1), unless the authorization is terminated or revoked sooner.  Performed at [facility], [address] [place], [place] [address]   Hospital Outpatient Visit on 07/22/2020  Component Date Value Ref Range Status   MRSA, PCR 07/22/2020 POSITIVE (A) NEGATIVE Final   Comment: RESULT CALLED TO, READ BACK BY AND VERIFIED WITH: NEAL,G AT 1625 ON 07/22/2020 BY MOSLEY,J    Staphylococcus aureus 07/22/2020 POSITIVE (A) NEGATIVE Final   Comment: (NOTE) The Xpert SA Assay (FDA approved for NASAL specimens in patients 71 years of age and older), is one component of a comprehensive surveillance program. It is not intended to diagnose infection nor to guide or monitor treatment. Performed at [facility], [address] [place], [place] [address]    WBC 07/22/2020 5.1  4.0 - 10.5 K/uL Final   RBC 07/22/2020 4.07  3.87 - 5.11 MIL/uL Final   Hemoglobin 07/22/2020 13.0  12.0 - 15.0 g/dL Final   HCT 44/01/027205/23/2022 39.2  36.0 - 46.0 % Final   MCV 07/22/2020 96.3  80.0 - 100.0 fL Final   MCH 07/22/2020 31.9  26.0 - 34.0 pg Final   MCHC 07/22/2020 33.2  30.0 - 36.0 g/dL Final   RDW 53/66/440305/23/2022 12.3  11.5 - 15.5 % Final   Platelets 07/22/2020 233  150 - 400 K/uL Final   nRBC 07/22/2020 0.0  0.0 - 0.2 % Final   Performed at [facility], [address] [place], [place] [address]   Sodium 07/22/2020 135  135 - 145 mmol/L Final   Potassium  07/22/2020 4.1  3.5 - 5.1 mmol/L Final   Chloride 07/22/2020 105  98 - 111 mmol/L Final   CO2 07/22/2020 24  22 - 32 mmol/L Final   Glucose, Bld 07/22/2020 103 (A) 70 - 99 mg/dL Final   Glucose reference range applies only to samples taken after fasting for at least 8 hours.   BUN 07/22/2020 29 (A) 8 - 23 mg/dL Final   Creatinine, Ser 07/22/2020 0.83  0.44 - 1.00 mg/dL Final   Calcium 95/63/875605/23/2022 9.5  8.9 - 10.3 mg/dL Final   Total Protein 43/32/951805/23/2022 7.2  6.5 - 8.1 g/dL Final   Albumin 84/16/606305/23/2022 4.2  3.5 - 5.0 g/dL Final   AST 01/60/109305/23/2022 19  15 - 41 U/L Final   ALT 07/22/2020 21  0 - 44 U/L Final   Alkaline Phosphatase 07/22/2020 81  38 - 126 U/L Final   Total Bilirubin 07/22/2020 0.9  0.3 - 1.2 mg/dL Final   GFR, Estimated 07/22/2020 >60  >60 mL/min Final   Comment: (NOTE) Calculated using the CKD-EPI Creatinine Equation (2021)    Anion gap 07/22/2020 6  5 - 15 Final   Performed at [facility], [address] [place], [place] [address]   Prothrombin Time 07/22/2020 13.1  11.4 - 15.2 seconds Final   INR 07/22/2020 1.0  0.8 - 1.2 Final   Comment: (NOTE) INR goal varies based on device and disease states. Performed at [facility], [address], [place], [place] [address]    aPTT 07/22/2020 32  24 - 36 seconds Final   Performed at [facility], [address] [place], [place] [address]   ABO/RH(D) 07/22/2020 A POS   Final   Antibody Screen 07/22/2020 NEG   Final   Sample Expiration 07/22/2020 08/02/2020,2359   Final   Extend sample reason 07/22/2020    Final                   Value:NO TRANSFUSIONS OR PREGNANCY IN THE PAST 3 MONTHS Performed at [facility], [address] [place], [place] [address]      X-Rays:DG Chest 2 View  Result Date: 07/19/2020 CLINICAL DATA:  Preoperative evaluation EXAM: CHEST - 2 VIEW COMPARISON:  01/10/2019 FINDINGS: Frontal and lateral views of the chest demonstrate a  stable cardiac silhouette. No airspace disease, effusion, or pneumothorax. No acute bony abnormality. IMPRESSION: 1. No acute intrathoracic process. Electronically Signed   By: Sharlet Salina M.D.   On: 07/19/2020 09:54    EKG: Orders placed or performed during the hospital encounter of 01/25/19   EKG 12 lead   EKG 12 lead     Hospital Course: Kwana Ringel Grieshaber is a 70 y.o. who was admitted to [facility]. They were brought to the operating room on 07/30/2020 and underwent Procedure(s): TOTAL KNEE ARTHROPLASTY.  Patient tolerated the procedure well and was later transferred to the recovery room and then to the orthopaedic floor for postoperative care. They were given PO and IV analgesics for pain control following their surgery. They were given 24 hours of postoperative antibiotics of  Anti-infectives (From admission, onward)    Start     Dose/Rate Route Frequency Ordered Stop   07/30/20 1630  ceFAZolin (ANCEF) IVPB 2g/100 mL premix        2 g 200 mL/hr over 30 Minutes Intravenous Every 6 hours 07/30/20 1335 07/30/20 2232   07/30/20 0800  ceFAZolin (ANCEF) IVPB 2g/100 mL premix        2 g 200 mL/hr over 30 Minutes Intravenous On call to O.R. 07/30/20 0745 07/30/20 1100   07/30/20 0800  vancomycin (VANCOCIN) IVPB 1000 mg/200 mL premix        1,000 mg 200 mL/hr over 60 Minutes Intravenous  Once 07/30/20 0745 07/30/20 1020      and started on DVT prophylaxis in the form of Aspirin.   PT and OT were ordered for total joint protocol. Discharge planning consulted to help with postop disposition and equipment needs.  Patient had a good night on the evening of surgery. They started to get up OOB with therapy on POD #0. Pt was seen during rounds and was ready to go home pending progress with therapy.She worked with therapy on POD #  1 and was meeting her goals. Pt was discharged to home later that day in stable condition.  Diet: Regular diet Activity: WBAT Follow-up: in 2  weeks Disposition: Home Discharged Condition: good   Discharge Instructions     Call MD / Call 911   Complete by: As directed    If you experience chest pain or shortness of breath, CALL 911 and be transported to the hospital emergency room.  If you develope a fever above 101 F, pus (white drainage) or increased drainage or redness at the wound, or calf pain, call your surgeon's office.   Change dressing   Complete by: As directed    Maintain surgical dressing until follow up in the clinic. If the edges start to pull up, may reinforce with tape. If the dressing is no longer working, may remove and cover with gauze and tape, but must keep the area dry and clean.  Call with any questions or concerns.   Constipation Prevention   Complete by: As directed    Drink plenty of fluids.  Prune juice may be helpful.  You may use a stool softener, such as Colace (over the counter) 100 mg twice a day.  Use MiraLax (over the counter) for constipation as needed.   Diet - low sodium heart healthy   Complete by: As directed    Increase activity slowly as tolerated   Complete by: As directed    Weight bearing as tolerated with assist device (walker, cane, etc) as directed, use it as long as suggested by your surgeon or therapist, typically at least 4-6 weeks.   Post-operative opioid taper instructions:   Complete by: As directed    POST-OPERATIVE OPIOID TAPER INSTRUCTIONS: It is important to wean off of your opioid medication as soon as possible. If you do not need pain medication after your surgery it is ok to stop day one. Opioids include: Codeine, Hydrocodone(Norco, Vicodin), Oxycodone(Percocet, oxycontin) and hydromorphone amongst others.  Long term and even short term use of opiods can cause: Increased pain response Dependence Constipation Depression Respiratory depression And more.  Withdrawal symptoms can include Flu like symptoms Nausea, vomiting And more Techniques to manage these  symptoms Hydrate well Eat regular healthy meals Stay active Use relaxation techniques(deep breathing, meditating, yoga) Do Not substitute Alcohol to help with tapering If you have been on opioids for less than two weeks and do not have pain than it is ok to stop all together.  Plan to wean off of opioids This plan should start within one week post op of your joint replacement. Maintain the same interval or time between taking each dose and first decrease the dose.  Cut the total daily intake of opioids by one tablet each day Next start to increase the time between doses. The last dose that should be eliminated is the evening dose.      TED hose   Complete by: As directed    Use stockings (TED hose) for 2 weeks on both leg(s).  You may remove them at night for sleeping.      Allergies as of 07/31/2020       Reactions   Dust Mite Extract    Cat /  causes runny eyes        Medication List     STOP taking these medications    diclofenac 75 MG EC tablet Commonly known as: VOLTAREN   meloxicam 15 MG tablet Commonly known as: MOBIC       TAKE these  medications    albuterol 108 (90 Base) MCG/ACT inhaler Commonly known as: VENTOLIN HFA Inhale 2 puffs into the lungs every 4 (four) hours as needed for shortness of breath or wheezing.   amLODipine 5 MG tablet Commonly known as: NORVASC Take 5 mg by mouth daily.   aspirin 81 MG chewable tablet Chew 1 tablet (81 mg total) by mouth 2 (two) times daily for 28 days.   atorvastatin 20 MG tablet Commonly known as: LIPITOR Take 20 mg by mouth at bedtime.   calcium carbonate 600 MG Tabs tablet Commonly known as: OS-CAL Take 600 mg by mouth daily with breakfast.   celecoxib 200 MG capsule Commonly known as: CELEBREX Take 1 capsule (200 mg total) by mouth 2 (two) times daily.   docusate sodium 100 MG capsule Commonly known as: COLACE Take 1 capsule (100 mg total) by mouth 2 (two) times daily.    HYDROcodone-acetaminophen 5-325 MG tablet Commonly known as: NORCO/VICODIN Take 1-2 tablets by mouth every 6 (six) hours as needed.   levothyroxine 75 MCG tablet Commonly known as: Synthroid Take 1 tablet (75 mcg total) by mouth daily.   methocarbamol 500 MG tablet Commonly known as: ROBAXIN Take 1 tablet (500 mg total) by mouth every 6 (six) hours as needed for muscle spasms.   omeprazole 40 MG capsule Commonly known as: PRILOSEC Take 40 mg by mouth at bedtime.   polyethylene glycol 17 g packet Commonly known as: MIRALAX / GLYCOLAX Take 17 g by mouth daily as needed for mild constipation.   valsartan-hydrochlorothiazide 320-25 MG tablet Commonly known as: DIOVAN-HCT Take 1 tablet by mouth daily.               Discharge Care Instructions  (From admission, onward)           Start     Ordered   07/31/20 0000  Change dressing       Comments: Maintain surgical dressing until follow up in the clinic. If the edges start to pull up, may reinforce with tape. If the dressing is no longer working, may remove and cover with gauze and tape, but must keep the area dry and clean.  Call with any questions or concerns.   07/31/20 0804            Follow-up Information     Durene Romans, MD. Schedule an appointment as soon as possible for a visit in 2 weeks.   Specialty: Orthopedic Surgery Contact information: [address] [place] [address] [place] [address] [phone]                 Signed: Dennie Bible, PA-C Orthopedic Surgery 08/11/2020, 9:16 AM

## 2020-08-12 DIAGNOSIS — M25662 Stiffness of left knee, not elsewhere classified: Secondary | ICD-10-CM | POA: Diagnosis not present

## 2020-08-12 DIAGNOSIS — M6281 Muscle weakness (generalized): Secondary | ICD-10-CM | POA: Diagnosis not present

## 2020-08-12 DIAGNOSIS — M25562 Pain in left knee: Secondary | ICD-10-CM | POA: Diagnosis not present

## 2020-08-15 DIAGNOSIS — M6281 Muscle weakness (generalized): Secondary | ICD-10-CM | POA: Diagnosis not present

## 2020-08-15 DIAGNOSIS — M25662 Stiffness of left knee, not elsewhere classified: Secondary | ICD-10-CM | POA: Diagnosis not present

## 2020-08-15 DIAGNOSIS — M25562 Pain in left knee: Secondary | ICD-10-CM | POA: Diagnosis not present

## 2020-08-19 DIAGNOSIS — M6281 Muscle weakness (generalized): Secondary | ICD-10-CM | POA: Diagnosis not present

## 2020-08-19 DIAGNOSIS — M25562 Pain in left knee: Secondary | ICD-10-CM | POA: Diagnosis not present

## 2020-08-19 DIAGNOSIS — M25662 Stiffness of left knee, not elsewhere classified: Secondary | ICD-10-CM | POA: Diagnosis not present

## 2020-08-23 DIAGNOSIS — M6281 Muscle weakness (generalized): Secondary | ICD-10-CM | POA: Diagnosis not present

## 2020-08-23 DIAGNOSIS — M25562 Pain in left knee: Secondary | ICD-10-CM | POA: Diagnosis not present

## 2020-08-23 DIAGNOSIS — M25662 Stiffness of left knee, not elsewhere classified: Secondary | ICD-10-CM | POA: Diagnosis not present

## 2020-08-26 DIAGNOSIS — M25662 Stiffness of left knee, not elsewhere classified: Secondary | ICD-10-CM | POA: Diagnosis not present

## 2020-08-26 DIAGNOSIS — M25562 Pain in left knee: Secondary | ICD-10-CM | POA: Diagnosis not present

## 2020-08-26 DIAGNOSIS — M6281 Muscle weakness (generalized): Secondary | ICD-10-CM | POA: Diagnosis not present

## 2020-08-29 DIAGNOSIS — M6281 Muscle weakness (generalized): Secondary | ICD-10-CM | POA: Diagnosis not present

## 2020-08-29 DIAGNOSIS — M25562 Pain in left knee: Secondary | ICD-10-CM | POA: Diagnosis not present

## 2020-08-29 DIAGNOSIS — M25662 Stiffness of left knee, not elsewhere classified: Secondary | ICD-10-CM | POA: Diagnosis not present

## 2020-09-03 DIAGNOSIS — M6281 Muscle weakness (generalized): Secondary | ICD-10-CM | POA: Diagnosis not present

## 2020-09-03 DIAGNOSIS — M25662 Stiffness of left knee, not elsewhere classified: Secondary | ICD-10-CM | POA: Diagnosis not present

## 2020-09-03 DIAGNOSIS — M25562 Pain in left knee: Secondary | ICD-10-CM | POA: Diagnosis not present

## 2020-09-05 DIAGNOSIS — M25662 Stiffness of left knee, not elsewhere classified: Secondary | ICD-10-CM | POA: Diagnosis not present

## 2020-09-05 DIAGNOSIS — M25562 Pain in left knee: Secondary | ICD-10-CM | POA: Diagnosis not present

## 2020-09-05 DIAGNOSIS — M6281 Muscle weakness (generalized): Secondary | ICD-10-CM | POA: Diagnosis not present

## 2020-09-09 DIAGNOSIS — M6281 Muscle weakness (generalized): Secondary | ICD-10-CM | POA: Diagnosis not present

## 2020-09-09 DIAGNOSIS — M25562 Pain in left knee: Secondary | ICD-10-CM | POA: Diagnosis not present

## 2020-09-09 DIAGNOSIS — M25662 Stiffness of left knee, not elsewhere classified: Secondary | ICD-10-CM | POA: Diagnosis not present

## 2020-09-12 DIAGNOSIS — M25662 Stiffness of left knee, not elsewhere classified: Secondary | ICD-10-CM | POA: Diagnosis not present

## 2020-09-12 DIAGNOSIS — M6281 Muscle weakness (generalized): Secondary | ICD-10-CM | POA: Diagnosis not present

## 2020-09-12 DIAGNOSIS — M25562 Pain in left knee: Secondary | ICD-10-CM | POA: Diagnosis not present

## 2020-09-16 DIAGNOSIS — M25662 Stiffness of left knee, not elsewhere classified: Secondary | ICD-10-CM | POA: Diagnosis not present

## 2020-09-16 DIAGNOSIS — M6281 Muscle weakness (generalized): Secondary | ICD-10-CM | POA: Diagnosis not present

## 2020-09-16 DIAGNOSIS — M25562 Pain in left knee: Secondary | ICD-10-CM | POA: Diagnosis not present

## 2020-09-19 DIAGNOSIS — Z4789 Encounter for other orthopedic aftercare: Secondary | ICD-10-CM | POA: Diagnosis not present

## 2020-11-26 DIAGNOSIS — Z1231 Encounter for screening mammogram for malignant neoplasm of breast: Secondary | ICD-10-CM | POA: Diagnosis not present

## 2020-11-26 DIAGNOSIS — M8588 Other specified disorders of bone density and structure, other site: Secondary | ICD-10-CM | POA: Diagnosis not present

## 2020-11-26 DIAGNOSIS — E039 Hypothyroidism, unspecified: Secondary | ICD-10-CM | POA: Diagnosis not present

## 2020-11-26 DIAGNOSIS — M5136 Other intervertebral disc degeneration, lumbar region: Secondary | ICD-10-CM | POA: Diagnosis not present

## 2020-11-26 DIAGNOSIS — N958 Other specified menopausal and perimenopausal disorders: Secondary | ICD-10-CM | POA: Diagnosis not present

## 2020-12-04 DIAGNOSIS — Z6826 Body mass index (BMI) 26.0-26.9, adult: Secondary | ICD-10-CM | POA: Diagnosis not present

## 2020-12-04 DIAGNOSIS — Z01419 Encounter for gynecological examination (general) (routine) without abnormal findings: Secondary | ICD-10-CM | POA: Diagnosis not present

## 2020-12-05 ENCOUNTER — Other Ambulatory Visit: Payer: Self-pay | Admitting: Obstetrics and Gynecology

## 2020-12-05 ENCOUNTER — Other Ambulatory Visit (HOSPITAL_COMMUNITY): Payer: Self-pay | Admitting: Obstetrics and Gynecology

## 2020-12-05 DIAGNOSIS — E041 Nontoxic single thyroid nodule: Secondary | ICD-10-CM

## 2020-12-13 ENCOUNTER — Ambulatory Visit (HOSPITAL_COMMUNITY)
Admission: RE | Admit: 2020-12-13 | Discharge: 2020-12-13 | Disposition: A | Discharge status: Home / Self Care | Payer: Medicare HMO | Source: Ambulatory Visit | Attending: Obstetrics and Gynecology | Admitting: Obstetrics and Gynecology

## 2020-12-13 ENCOUNTER — Other Ambulatory Visit: Payer: Self-pay

## 2020-12-13 DIAGNOSIS — E041 Nontoxic single thyroid nodule: Secondary | ICD-10-CM | POA: Insufficient documentation

## 2020-12-19 DIAGNOSIS — H6122 Impacted cerumen, left ear: Secondary | ICD-10-CM | POA: Diagnosis not present

## 2020-12-19 DIAGNOSIS — H6123 Impacted cerumen, bilateral: Secondary | ICD-10-CM | POA: Diagnosis not present

## 2020-12-20 DIAGNOSIS — L570 Actinic keratosis: Secondary | ICD-10-CM | POA: Diagnosis not present

## 2020-12-20 DIAGNOSIS — L218 Other seborrheic dermatitis: Secondary | ICD-10-CM | POA: Diagnosis not present

## 2020-12-20 DIAGNOSIS — X32XXXD Exposure to sunlight, subsequent encounter: Secondary | ICD-10-CM | POA: Diagnosis not present

## 2021-01-16 DIAGNOSIS — U071 COVID-19: Secondary | ICD-10-CM | POA: Diagnosis not present

## 2021-02-13 DIAGNOSIS — M5416 Radiculopathy, lumbar region: Secondary | ICD-10-CM | POA: Diagnosis not present

## 2021-03-12 DIAGNOSIS — M25511 Pain in right shoulder: Secondary | ICD-10-CM | POA: Diagnosis not present

## 2021-03-21 DIAGNOSIS — M25511 Pain in right shoulder: Secondary | ICD-10-CM | POA: Diagnosis not present

## 2021-03-28 DIAGNOSIS — M25511 Pain in right shoulder: Secondary | ICD-10-CM | POA: Diagnosis not present

## 2021-04-04 DIAGNOSIS — M25511 Pain in right shoulder: Secondary | ICD-10-CM | POA: Diagnosis not present

## 2021-04-11 DIAGNOSIS — M25511 Pain in right shoulder: Secondary | ICD-10-CM | POA: Diagnosis not present

## 2021-04-18 DIAGNOSIS — M25511 Pain in right shoulder: Secondary | ICD-10-CM | POA: Diagnosis not present

## 2021-04-24 DIAGNOSIS — E039 Hypothyroidism, unspecified: Secondary | ICD-10-CM | POA: Diagnosis not present

## 2021-04-24 DIAGNOSIS — E038 Other specified hypothyroidism: Secondary | ICD-10-CM | POA: Diagnosis not present

## 2021-04-24 DIAGNOSIS — E7800 Pure hypercholesterolemia, unspecified: Secondary | ICD-10-CM | POA: Diagnosis not present

## 2021-04-24 DIAGNOSIS — I10 Essential (primary) hypertension: Secondary | ICD-10-CM | POA: Diagnosis not present

## 2021-04-24 DIAGNOSIS — Z79899 Other long term (current) drug therapy: Secondary | ICD-10-CM | POA: Diagnosis not present

## 2021-04-24 DIAGNOSIS — J4520 Mild intermittent asthma, uncomplicated: Secondary | ICD-10-CM | POA: Diagnosis not present

## 2021-04-24 DIAGNOSIS — Z0001 Encounter for general adult medical examination with abnormal findings: Secondary | ICD-10-CM | POA: Diagnosis not present

## 2021-05-07 DIAGNOSIS — M7541 Impingement syndrome of right shoulder: Secondary | ICD-10-CM | POA: Diagnosis not present

## 2021-05-07 DIAGNOSIS — M25511 Pain in right shoulder: Secondary | ICD-10-CM | POA: Diagnosis not present

## 2021-06-19 DIAGNOSIS — K219 Gastro-esophageal reflux disease without esophagitis: Secondary | ICD-10-CM | POA: Diagnosis not present

## 2021-06-19 DIAGNOSIS — K449 Diaphragmatic hernia without obstruction or gangrene: Secondary | ICD-10-CM | POA: Diagnosis not present

## 2021-06-19 DIAGNOSIS — K5730 Diverticulosis of large intestine without perforation or abscess without bleeding: Secondary | ICD-10-CM | POA: Diagnosis not present

## 2021-06-19 DIAGNOSIS — R197 Diarrhea, unspecified: Secondary | ICD-10-CM | POA: Diagnosis not present

## 2021-06-19 DIAGNOSIS — E039 Hypothyroidism, unspecified: Secondary | ICD-10-CM | POA: Diagnosis not present

## 2021-06-19 DIAGNOSIS — I10 Essential (primary) hypertension: Secondary | ICD-10-CM | POA: Diagnosis not present

## 2021-06-19 DIAGNOSIS — E785 Hyperlipidemia, unspecified: Secondary | ICD-10-CM | POA: Diagnosis not present

## 2021-06-19 DIAGNOSIS — Z86010 Personal history of colonic polyps: Secondary | ICD-10-CM | POA: Diagnosis not present

## 2021-06-20 DIAGNOSIS — X32XXXD Exposure to sunlight, subsequent encounter: Secondary | ICD-10-CM | POA: Diagnosis not present

## 2021-06-20 DIAGNOSIS — L570 Actinic keratosis: Secondary | ICD-10-CM | POA: Diagnosis not present

## 2021-12-16 DIAGNOSIS — Z01419 Encounter for gynecological examination (general) (routine) without abnormal findings: Secondary | ICD-10-CM | POA: Diagnosis not present

## 2021-12-16 DIAGNOSIS — Z1231 Encounter for screening mammogram for malignant neoplasm of breast: Secondary | ICD-10-CM | POA: Diagnosis not present

## 2021-12-16 DIAGNOSIS — Z6827 Body mass index (BMI) 27.0-27.9, adult: Secondary | ICD-10-CM | POA: Diagnosis not present

## 2021-12-18 DIAGNOSIS — X32XXXD Exposure to sunlight, subsequent encounter: Secondary | ICD-10-CM | POA: Diagnosis not present

## 2021-12-18 DIAGNOSIS — L570 Actinic keratosis: Secondary | ICD-10-CM | POA: Diagnosis not present

## 2022-02-12 DIAGNOSIS — M5416 Radiculopathy, lumbar region: Secondary | ICD-10-CM | POA: Diagnosis not present

## 2022-04-29 DIAGNOSIS — Z79899 Other long term (current) drug therapy: Secondary | ICD-10-CM | POA: Diagnosis not present

## 2022-04-29 DIAGNOSIS — R7301 Impaired fasting glucose: Secondary | ICD-10-CM | POA: Diagnosis not present

## 2022-04-29 DIAGNOSIS — E039 Hypothyroidism, unspecified: Secondary | ICD-10-CM | POA: Diagnosis not present

## 2022-04-29 DIAGNOSIS — E7800 Pure hypercholesterolemia, unspecified: Secondary | ICD-10-CM | POA: Diagnosis not present

## 2022-04-29 DIAGNOSIS — Z0001 Encounter for general adult medical examination with abnormal findings: Secondary | ICD-10-CM | POA: Diagnosis not present

## 2022-04-29 DIAGNOSIS — I10 Essential (primary) hypertension: Secondary | ICD-10-CM | POA: Diagnosis not present

## 2022-04-29 DIAGNOSIS — J4520 Mild intermittent asthma, uncomplicated: Secondary | ICD-10-CM | POA: Diagnosis not present

## 2022-06-18 DIAGNOSIS — X32XXXD Exposure to sunlight, subsequent encounter: Secondary | ICD-10-CM | POA: Diagnosis not present

## 2022-06-18 DIAGNOSIS — L570 Actinic keratosis: Secondary | ICD-10-CM | POA: Diagnosis not present

## 2022-07-07 DIAGNOSIS — Z800 Family history of malignant neoplasm of digestive organs: Secondary | ICD-10-CM | POA: Diagnosis not present

## 2022-07-07 DIAGNOSIS — K5730 Diverticulosis of large intestine without perforation or abscess without bleeding: Secondary | ICD-10-CM | POA: Diagnosis not present

## 2022-07-07 DIAGNOSIS — K219 Gastro-esophageal reflux disease without esophagitis: Secondary | ICD-10-CM | POA: Diagnosis not present

## 2022-07-07 DIAGNOSIS — K449 Diaphragmatic hernia without obstruction or gangrene: Secondary | ICD-10-CM | POA: Diagnosis not present

## 2022-07-07 DIAGNOSIS — Z86010 Personal history of colonic polyps: Secondary | ICD-10-CM | POA: Diagnosis not present

## 2022-07-07 DIAGNOSIS — Z1211 Encounter for screening for malignant neoplasm of colon: Secondary | ICD-10-CM | POA: Diagnosis not present

## 2022-09-21 DIAGNOSIS — Z1211 Encounter for screening for malignant neoplasm of colon: Secondary | ICD-10-CM | POA: Diagnosis not present

## 2022-09-21 DIAGNOSIS — Z09 Encounter for follow-up examination after completed treatment for conditions other than malignant neoplasm: Secondary | ICD-10-CM | POA: Diagnosis not present

## 2022-09-21 DIAGNOSIS — K5730 Diverticulosis of large intestine without perforation or abscess without bleeding: Secondary | ICD-10-CM | POA: Diagnosis not present

## 2022-09-21 DIAGNOSIS — Z86010 Personal history of colonic polyps: Secondary | ICD-10-CM | POA: Diagnosis not present

## 2022-12-30 ENCOUNTER — Other Ambulatory Visit (HOSPITAL_BASED_OUTPATIENT_CLINIC_OR_DEPARTMENT_OTHER): Payer: Self-pay | Admitting: Obstetrics and Gynecology

## 2022-12-30 DIAGNOSIS — Z6826 Body mass index (BMI) 26.0-26.9, adult: Secondary | ICD-10-CM | POA: Diagnosis not present

## 2022-12-30 DIAGNOSIS — Z1231 Encounter for screening mammogram for malignant neoplasm of breast: Secondary | ICD-10-CM | POA: Diagnosis not present

## 2022-12-30 DIAGNOSIS — Z8249 Family history of ischemic heart disease and other diseases of the circulatory system: Secondary | ICD-10-CM

## 2022-12-30 DIAGNOSIS — Z01419 Encounter for gynecological examination (general) (routine) without abnormal findings: Secondary | ICD-10-CM | POA: Diagnosis not present

## 2023-01-14 ENCOUNTER — Ambulatory Visit (HOSPITAL_COMMUNITY)
Admission: RE | Admit: 2023-01-14 | Discharge: 2023-01-14 | Disposition: A | Discharge status: Home / Self Care | Payer: Medicare HMO | Source: Ambulatory Visit | Attending: Obstetrics and Gynecology | Admitting: Obstetrics and Gynecology

## 2023-01-14 DIAGNOSIS — Z8249 Family history of ischemic heart disease and other diseases of the circulatory system: Secondary | ICD-10-CM | POA: Insufficient documentation

## 2023-01-15 ENCOUNTER — Other Ambulatory Visit (HOSPITAL_COMMUNITY): Payer: Self-pay | Admitting: *Deleted

## 2023-01-15 ENCOUNTER — Ambulatory Visit (HOSPITAL_COMMUNITY)
Admission: RE | Admit: 2023-01-15 | Discharge: 2023-01-15 | Disposition: A | Discharge status: Home / Self Care | Payer: Medicare HMO | Source: Ambulatory Visit | Attending: Obstetrics and Gynecology | Admitting: Obstetrics and Gynecology

## 2023-01-15 DIAGNOSIS — I2510 Atherosclerotic heart disease of native coronary artery without angina pectoris: Secondary | ICD-10-CM

## 2023-02-05 DIAGNOSIS — L308 Other specified dermatitis: Secondary | ICD-10-CM | POA: Diagnosis not present

## 2023-03-05 DIAGNOSIS — L308 Other specified dermatitis: Secondary | ICD-10-CM | POA: Diagnosis not present

## 2023-03-05 DIAGNOSIS — X32XXXD Exposure to sunlight, subsequent encounter: Secondary | ICD-10-CM | POA: Diagnosis not present

## 2023-03-05 DIAGNOSIS — L570 Actinic keratosis: Secondary | ICD-10-CM | POA: Diagnosis not present

## 2023-04-06 ENCOUNTER — Encounter: Payer: Self-pay | Admitting: *Deleted

## 2023-04-06 ENCOUNTER — Encounter: Payer: Self-pay | Admitting: Cardiology

## 2023-04-06 ENCOUNTER — Ambulatory Visit: Payer: Medicare HMO | Attending: Cardiology | Admitting: Cardiology

## 2023-04-06 VITALS — BP 132/79 | HR 72 | Resp 16 | Ht 65.0 in | Wt 162.4 lb

## 2023-04-06 DIAGNOSIS — R931 Abnormal findings on diagnostic imaging of heart and coronary circulation: Secondary | ICD-10-CM | POA: Diagnosis not present

## 2023-04-06 DIAGNOSIS — I77810 Thoracic aortic ectasia: Secondary | ICD-10-CM | POA: Diagnosis not present

## 2023-04-06 DIAGNOSIS — R9431 Abnormal electrocardiogram [ECG] [EKG]: Secondary | ICD-10-CM

## 2023-04-06 DIAGNOSIS — E7800 Pure hypercholesterolemia, unspecified: Secondary | ICD-10-CM

## 2023-04-06 DIAGNOSIS — R0609 Other forms of dyspnea: Secondary | ICD-10-CM | POA: Diagnosis not present

## 2023-04-06 MED ORDER — EZETIMIBE 10 MG PO TABS: 10.0000 mg | ORAL_TABLET | Freq: Every day | ORAL | 2 refills | Status: DC

## 2023-04-06 NOTE — Progress Notes (Signed)
 Cardiology Office Note:  .   Date:  04/06/2023  ID:  Melissa Collier, DOB 1951-02-09, MRN [id number] PCP: Regino Slater, MD  [facility] Providers Cardiologist:  Gordy Bergamo, MD   History of Present Illness: .   Melissa Collier is a 73 y.o.   Discussed the use of AI scribe software for clinical note transcription with the patient, who gave verbal consent to proceed.  History of Present Illness   Melissa Collier is a 73 year old female who presents for cardiology evaluation. She was referred by Dr. John McComb for evaluation of elevated coronary calcium  score.  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  She has been on atorvastatin  20 mg daily for years for cholesterol management and blood pressure medication for approximately 15 years. Her coronary calcium  score was found to be high, prompting a referral to cardiology.  Except for dyspnea that she has noticed over time which is considered to be mild, particularly when working outside with her horses, which she attributes to her asthma. She uses albuterol  frequently, especially in cold weather, to manage her symptoms.   No chest pain, but over the past year, she has needed to rest more frequently while working to catch her breath. No leg swelling or waking up at night with breathing difficulties, but she reports breathing heavier at night while at rest and has a history of mild sleep apnea.  She does not smoke and drinks one to two glasses of wine nightly. Family history is notable for her grandfather who died of cirrhosis in his fifties.     Labs    Lab Results  Component Value Date   NA 136 07/31/2020   K 3.9 07/31/2020   CO2 26 07/31/2020   GLUCOSE 130 (H) 07/31/2020   BUN 14 07/31/2020   CREATININE 0.73 07/31/2020   CALCIUM  8.8 (L) 07/31/2020   GFRNONAA >60 07/31/2020      Latest Ref Rng & Units 07/31/2020    3:14 AM 07/22/2020   11:29  AM 02/01/2019    2:42 AM  BMP  Glucose 70 - 99 mg/dL 869  896  856   BUN 8 - 23 mg/dL 14  29  17    Creatinine 0.44 - 1.00 mg/dL 9.26  9.16  9.22   Sodium 135 - 145 mmol/L 136  135  136   Potassium 3.5 - 5.1 mmol/L 3.9  4.1  3.8   Chloride 98 - 111 mmol/L 103  105  105   CO2 22 - 32 mmol/L 26  24  21    Calcium  8.9 - 10.3 mg/dL 8.8  9.5  8.8       Latest Ref Rng & Units 07/31/2020    3:14 AM 07/22/2020   11:29 AM 02/01/2019    2:42 AM  CBC  WBC 4.0 - 10.5 K/uL 8.6  5.1  10.8   Hemoglobin 12.0 - 15.0 g/dL 89.3  86.9  88.1   Hematocrit 36.0 - 46.0 % 31.8  39.2  35.8   Platelets 150 - 400 K/uL 201  233  199    External Labs:  Care Everywhere/PCP labs 04/30/2022:  Total cholesterol 209, triglycerides 128, HDL 48, LDL 138.  A1c 5.6%.  TSH normal at 1.18.  Serum glucose 1 and 10 mg, BUN 16, creatinine 0.76, EGFR 83 mL, potassium 4.1.  Review of Systems  Cardiovascular:  Positive for dyspnea on exertion.  Negative for chest pain, leg swelling, orthopnea and paroxysmal nocturnal dyspnea.   Physical Exam:   VS:  BP 132/79 (BP Location: Left Arm, Patient Position: Sitting, Cuff Size: Large)   Pulse 72   Resp 16   Ht 5' 5 (1.651 m)   Wt 162 lb 6.4 oz (73.7 kg)   SpO2 94%   BMI 27.02 kg/m    Wt Readings from Last 3 Encounters:  04/06/23 162 lb 6.4 oz (73.7 kg)  07/30/20 162 lb (73.5 kg)  07/22/20 162 lb (73.5 kg)    Physical Exam Neck:     Vascular: No carotid bruit or JVD.  Cardiovascular:     Rate and Rhythm: Normal rate and regular rhythm.     Pulses: Intact distal pulses.     Heart sounds: Normal heart sounds. No murmur heard.    No gallop.  Pulmonary:     Effort: Pulmonary effort is normal.     Breath sounds: Normal breath sounds.  Abdominal:     General: Bowel sounds are normal.     Palpations: Abdomen is soft.  Musculoskeletal:     Right lower leg: No edema.     Left lower leg: No edema.     Studies Reviewed: SABRA    Coronary calcium  score 01/18/2023 Total  Agatston coronary calcium  score 94.7. MESA database percentile 64. LM: 0 LAD: 0 LCx: 5.88 RCA: 88.8 Visualized ascending and descending aorta mildly dilated and ectatic at 4.3, aortic atherosclerosis. Extracardiac abnormalities: Pulmonary nodule Appearance of the liver suggest underlying cirrhosis.   EKG:    EKG Interpretation Date/Time:  Tuesday April 06 2023 10:47:59 EST Ventricular Rate:  73 PR Interval:  156 QRS Duration:  88 QT Interval:  390 QTC Calculation: 429 R Axis:   -19  Text Interpretation: EKG 04/06/2023: Normal sinus rhythm with rate of 73 bpm, normal axis, borderline LVH in aVL, nonspecific T abnormality in aVL and V6 new from 01/25/2019. Confirmed by Ankit Degregorio, Jagadeesh ([id number]) on 04/06/2023 11:01:08 AM    Medications and allergies    Allergies  Allergen Reactions   Dust Mite Extract     Cat /  causes runny eyes     Current Outpatient Medications:    albuterol  (VENTOLIN  HFA) 108 (90 Base) MCG/ACT inhaler, Inhale 2 puffs into the lungs every 4 (four) hours as needed for shortness of breath or wheezing., Disp: , Rfl:    amLODipine  (NORVASC ) 5 MG tablet, Take 5 mg by mouth daily., Disp: , Rfl:    atorvastatin  (LIPITOR) 20 MG tablet, Take 20 mg by mouth at bedtime., Disp: , Rfl:    calcium  carbonate (OS-CAL) 600 MG TABS tablet, Take 600 mg by mouth daily with breakfast., Disp: , Rfl:    celecoxib  (CELEBREX ) 200 MG capsule, Take 1 capsule (200 mg total) by mouth 2 (two) times daily., Disp: 60 capsule, Rfl: 0   ezetimibe  (ZETIA ) 10 MG tablet, Take 1 tablet (10 mg total) by mouth daily., Disp: 90 tablet, Rfl: 2   levothyroxine  (SYNTHROID ) 75 MCG tablet, Take 1 tablet (75 mcg total) by mouth daily., Disp: , Rfl:    omeprazole  (PRILOSEC) 40 MG capsule, Take 40 mg by mouth at bedtime. , Disp: , Rfl:    valsartan -hydrochlorothiazide  (DIOVAN -HCT) 320-25 MG tablet, Take 1 tablet by mouth daily., Disp: , Rfl:    ASSESSMENT AND PLAN: .      ICD-10-CM   1. Elevated  coronary artery calcium  score 01/14/2023: Total score 94.7, 64th percentile.  R93.1 EKG 12-Lead  Cardiac Stress Test: Informed Consent Details: Physician/Practitioner Attestation; Transcribe to consent form and obtain patient signature    MYOCARDIAL PERFUSION IMAGING    Comp Met (CMET)    Lipid Profile    2. Hypercholesteremia  E78.00 Comp Met (CMET)    Lipid Profile    3. Dyspnea on exertion  R06.09 ECHOCARDIOGRAM COMPLETE    MYOCARDIAL PERFUSION IMAGING    Comp Met (CMET)    4. Nonspecific abnormal electrocardiogram (ECG) (EKG)  R94.31 Cardiac Stress Test: Informed Consent Details: Physician/Practitioner Attestation; Transcribe to consent form and obtain patient signature    ECHOCARDIOGRAM COMPLETE    Comp Met (CMET)    5. Aortic root dilatation (HCC)  I77.810 ECHOCARDIOGRAM COMPLETE    Comp Met (CMET)     Assessment and Plan    Elevated Coronary Calcium  Score in the 64th percentile Coronary calcium  score is in the 64th percentile, indicating moderate risk.  Current atorvastatin  20 mg is insufficient as LDL remains at 138 mg/dL. Addition of ezetimibe  (Zetia ) can help lower LDL levels further, reducing cardiovascular risk. Importance of rechecking cholesterol and liver enzymes to monitor efficacy and safety discussed.  In the absence of diabetes mellitus, goal LDL <100. - Add ezetimibe  10 mg daily - Recheck cholesterol panel and liver enzymes in six weeks - Schedule exercise nuclear stress test - Order echocardiogram to evaluate heart function and aortic root dilatation - Follow-up in six to eight weeks  Dyspnea on Exertion Reports increased shortness of breath over the past year, particularly during physical activities. Differential includes asthma and potential cardiac causes. Echocardiogram and stress test planned to evaluate cardiac function. Stress test will help determine if there are underlying cardiac issues contributing to symptoms. - Order exercise nuclear stress  test - Order echocardiogram to evaluate heart function  Mild Aortic Root Dilatation CT scan shows mild aortic root dilatation. Blood pressure is well controlled with valsartan  and amlodipine . Echocardiogram needed to further evaluate the aortic root and correlate findings. Valsartan  helps prevent further dilatation; regular monitoring is important. 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 History of moderate alcohol consumption and family history of cirrhosis.  Appearance of cirrhosis on CT is not definitive; further testing, such as an ultrasound, may be needed to confirm diagnosis. -Follow-up with her PCP Dr. Elfrieda Haggard.  Follow-up - Follow-up appointment with cardiologist in six to eight weeks - Schedule CMP and lipid panel one week before follow-up.   Signed,  Gordy Bergamo, MD, [facility] 04/06/2023, 5:42 PM [facility] [address] [place], [place] [address] Phone: [phone]. Fax:  [phone]

## 2023-04-06 NOTE — Patient Instructions (Signed)
 Medication Instructions:  Your physician has recommended you make the following change in your medication: Start Zetia  10 mg by mouth daily   *If you need a refill on your cardiac medications before your next appointment, please call your pharmacy*   Lab Work: Have fasting lab work done in 6 weeks at [facility].  There is an office on the first floor of our building.  CMET and Lipids If you have labs (blood work) drawn today and your tests are completely normal, you will receive your results only by: MyChart Message (if you have MyChart) OR A paper copy in the mail If you have any lab test that is abnormal or we need to change your treatment, we will call you to review the results.   Testing/Procedures: Your physician has requested that you have an exercise stress myoview . For further information please visit [email or web address] Please follow instruction sheet, as given.   Your physician has requested that you have an echocardiogram. Echocardiography is a painless test that uses sound waves to create images of your heart. It provides your doctor with information about the size and shape of your heart and how well your heart's chambers and valves are working. This procedure takes approximately one hour. There are no restrictions for this procedure. Please do NOT wear cologne, perfume, aftershave, or lotions (deodorant is allowed). Please arrive 15 minutes prior to your appointment time.  Please note: We ask at that you not bring children with you during ultrasound (echo/ vascular) testing. Due to room size and safety concerns, children are not allowed in the ultrasound rooms during exams. Our front office staff cannot provide observation of children in our lobby area while testing is being conducted. An adult accompanying a patient to their appointment will only be allowed in the ultrasound room at the discretion of the ultrasound technician under special circumstances. We apologize for any  inconvenience.   Follow-Up: At [facility], you and your health needs are our priority.  As part of our continuing mission to provide you with exceptional heart care, we have created designated Provider Care Teams.  These Care Teams include your primary Cardiologist (physician) and Advanced Practice Providers (APPs -  Physician Assistants and Nurse Practitioners) who all work together to provide you with the care you need, when you need it.  We recommend signing up for the patient portal called MyChart.  Sign up information is provided on this After Visit Summary.  MyChart is used to connect with patients for Virtual Visits (Telemedicine).  Patients are able to view lab/test results, encounter notes, upcoming appointments, etc.  Non-urgent messages can be sent to your provider as well.   To learn more about what you can do with MyChart, go to [email or web address].    Your next appointment:   2 month(s)  Provider:   Gordy Bergamo, MD     Other Instructions

## 2023-04-07 ENCOUNTER — Institutional Professional Consult (permissible substitution): Payer: Medicare HMO | Admitting: Pulmonary Disease

## 2023-04-19 ENCOUNTER — Other Ambulatory Visit: Payer: Self-pay | Admitting: Acute Care

## 2023-04-19 ENCOUNTER — Ambulatory Visit (INDEPENDENT_AMBULATORY_CARE_PROVIDER_SITE_OTHER): Payer: Medicare HMO | Admitting: Acute Care

## 2023-04-19 ENCOUNTER — Encounter: Payer: Self-pay | Admitting: Acute Care

## 2023-04-19 VITALS — BP 149/80 | HR 75 | Temp 96.9°F | Ht 65.0 in | Wt 161.0 lb

## 2023-04-19 DIAGNOSIS — J4520 Mild intermittent asthma, uncomplicated: Secondary | ICD-10-CM | POA: Diagnosis not present

## 2023-04-19 DIAGNOSIS — R911 Solitary pulmonary nodule: Secondary | ICD-10-CM

## 2023-04-19 MED ORDER — AIRDUO DIGIHALER 113-14 MCG/ACT IN AEPB: 1.0000 | INHALATION_SPRAY | RESPIRATORY_TRACT | 2 refills | Status: AC | PRN

## 2023-04-19 NOTE — Patient Instructions (Addendum)
 It is good to see you today. We will do a CT Chest to better evaluate the lung nodule of concern. You will get a call to get this scheduled. You will follow up with me 1-2 weeks after you have the scan to go over the results.  I have sent in a prescription for Air Supra. Use 1-2 puffs as needed for shortness of breath or wheezing. Rinse mouth after use.  Call if you need [place] sooner  Please contact office for sooner follow up if symptoms do not improve or worsen or seek emergency care

## 2023-04-19 NOTE — Progress Notes (Signed)
 History of Present Illness Melissa Collier is a 73 y.o. female with asthma, referred by Dr. Arelia Sneddon for an incidental lung nodule  04/2023. She will be folowed by Dr. Delton Coombes   04/19/2023 Melissa Collier is a 73 year old female with asthma and a history of cardiac issues who presents for a follow-up on a pleural-based lesion found on a cardiac CT scan. She was referred by Dr. Arelia Sneddon for evaluation of her cardiac health due to a family history of heart disease.  A pleural-based lesion measuring 2.7 by 1.1 centimeters was identified on a cardiac CT scan performed in November. The lesion is unchanged compared to a prior scan. She has no history of smoking, unexplained weight loss, or hemoptysis. Her history includes stable thyroid nodules with no significant changes in recent ultrasounds.  She has asthma, which worsens during the winter and with respiratory infections. She experiences shortness of breath, particularly with physical activity and sometimes at rest, and uses an albuterol inhaler frequently, especially in the morning and evening. She describes her symptoms as 'a lot of congestion' and 'sounding like a tea kettle.' She has never used a maintenance inhaler and reports increased inhaler use over the past year. Wheezing and congestion affect her sleep and singing voice.  Her past medical history includes high blood pressure, hypercholesterolemia, mild sleep apnea, and arthritis. She has not used a CPAP machine as her sleep apnea was borderline. She has a history of acid reflux and indigestion, and joint stiffness and pain due to arthritis. She has taken prednisone in the past for sciatica but not since 2020.  Her family history is significant for heart disease, with her father, grandfather, and great-grandfather all having died from heart-related issues. Her mother and sister died of breast cancer, and her grandmothers had colon and stomach cancer, respectively.  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 [place] and has five grown children. She reports moderate alcohol use and no known allergies to latex, contrast dye, or aspirin. She works with [employer].  Test Results:     Latest Ref Rng & Units 07/31/2020    3:14 AM 07/22/2020   11:29 AM 02/01/2019    2:42 AM  CBC  WBC 4.0 - 10.5 K/uL 8.6  5.1  10.8   Hemoglobin 12.0 - 15.0 g/dL 16.1  09.6  04.5   Hematocrit 36.0 - 46.0 % 31.8  39.2  35.8   Platelets 150 - 400 K/uL 201  233  199        Latest Ref Rng & Units 07/31/2020    3:14 AM 07/22/2020   11:29 AM 02/01/2019    2:42 AM  BMP  Glucose 70 - 99 mg/dL 409  811  914   BUN 8 - 23 mg/dL 14  29  17    Creatinine 0.44 - 1.00 mg/dL 7.82  9.56  2.13   Sodium 135 - 145 mmol/L 136  135  136   Potassium 3.5 - 5.1 mmol/L 3.9  4.1  3.8   Chloride 98 - 111 mmol/L 103  105  105   CO2 22 - 32 mmol/L 26  24  21    Calcium 8.9 - 10.3 mg/dL 8.8  9.5  8.8     BNP No results found for: "BNP"  ProBNP No results found for: "PROBNP"  PFT No results found for: "FEV1PRE", "FEV1POST", "FVCPRE", "FVCPOST", "TLC", "DLCOUNC", "PREFEV1FVCRT", "PSTFEV1FVCRT"  No results found.  Past medical hx Past Medical History:  Diagnosis Date   Arthritis    Asthma    GERD (gastroesophageal reflux disease)    Hashimoto's thyroiditis    History of hiatal hernia    Hyperlipidemia    Hypertension    Hypothyroidism    Multinodular goiter    Osteopenia    Pneumonia    History of   Sciatica    Left greater than right   Sleep apnea    Mild, NO CPAP     Social History   Tobacco Use   Smoking status: Never   Smokeless tobacco: Never  Vaping Use   Vaping status: Never Used  Substance Use Topics   Alcohol use: Yes    Comment: 1-2 glasses of wine nightly    Drug use: Never    Ms.Heumann reports that she has never smoked. She has never used smokeless tobacco. She reports current alcohol use. She reports that she  does not use drugs.  Tobacco Cessation: Counseling given: Not Answered   Past surgical hx, Family hx, Social hx all reviewed.  Current Outpatient Medications on File Prior to Visit  Medication Sig   albuterol (VENTOLIN HFA) 108 (90 Base) MCG/ACT inhaler Inhale 2 puffs into the lungs every 4 (four) hours as needed for shortness of breath or wheezing.   amLODipine (NORVASC) 5 MG tablet Take 5 mg by mouth daily.   atorvastatin (LIPITOR) 20 MG tablet Take 20 mg by mouth at bedtime.   calcium carbonate (OS-CAL) 600 MG TABS tablet Take 600 mg by mouth daily with breakfast.   celecoxib (CELEBREX) 200 MG capsule Take 1 capsule (200 mg total) by mouth 2 (two) times daily.   ezetimibe (ZETIA) 10 MG tablet Take 1 tablet (10 mg total) by mouth daily.   levothyroxine (SYNTHROID) 75 MCG tablet Take 1 tablet (75 mcg total) by mouth daily.   omeprazole (PRILOSEC) 40 MG capsule Take 40 mg by mouth at bedtime.    valsartan-hydrochlorothiazide (DIOVAN-HCT) 320-25 MG tablet Take 1 tablet by mouth daily.   No current facility-administered medications on file prior to visit.     Allergies  Allergen Reactions   Dust Mite Extract     Cat /  causes runny eyes    Review Of Systems:  Constitutional:   No  weight loss, night sweats,  Fevers, chills, fatigue, or  lassitude.  HEENT:   No headaches,  Difficulty swallowing,  Tooth/dental problems, or  Sore throat,                No sneezing, itching, ear ache, nasal congestion, post nasal drip,   CV:  No chest pain,  Orthopnea, PND, swelling in lower extremities, anasarca, dizziness, palpitations, syncope.   GI  No heartburn, indigestion, abdominal pain, nausea, vomiting, diarrhea, change in bowel habits, loss of appetite, bloody stools.   Resp: + shortness of breath with exertion less at rest.  No excess mucus, no productive cough,  No non-productive cough,  + coughing up of blood.  No change in color of mucus.  No wheezing.  No chest wall  deformity  Skin: no rash or lesions.  GU: no dysuria, change in color of urine, no urgency or frequency.  No flank pain, no hematuria   MS:  No joint pain or swelling.  No decreased range of motion.  No back pain.  Psych:  No change in mood or affect. No depression or anxiety.  No memory loss.   Vital Signs BP (!) 149/80 (BP  Location: Left Arm, Patient Position: Sitting, Cuff Size: Normal)   Pulse 75   Temp (!) 96.9 F (36.1 C) (Temporal)   Ht 5\' 5"  (1.651 m)   Wt 161 lb (73 kg)   SpO2 97%   BMI 26.79 kg/m    Physical Exam:  General- No distress,  A&Ox3, pleasant ENT: No sinus tenderness, TM clear, pale nasal mucosa, no oral exudate,no post nasal drip, no LAN Cardiac: S1, S2, regular rate and rhythm, no murmur Chest: No wheeze/ rales/ dullness; no accessory muscle use, no nasal flaring, no sternal retractions, diminished per bases Abd.: Soft Non-tender, ND, BS +, Body mass index is 26.79 kg/m.  Ext: No clubbing cyanosis, edema, no obvious deformities Neuro:  normal strength,, MAE x 4, A&O x 3, appropriate Skin: No rashes, warm and dry,  no lesions  Psych: normal mood and behavior   Assessment/Plan Incidental lung nodule found on imaging Never Smoker Plan We will do a CT Chest to better evaluate the lung nodule of concern. You will get a call to get this scheduled. You will follow up with me 1-2 weeks after you have the scan to go over the results.  I have sent in a prescription for Air Supra. Use 1-2 puffs as needed for shortness of breath or wheezing. Rinse mouth after use.  Call if you need [place] sooner  Please contact office for sooner follow up if symptoms do not improve or worsen or seek emergency care   I spent 20 minutes dedicated to the care of this patient on the date of this encounter to include pre-visit review of records, face-to-face time with the patient discussing conditions above, post visit ordering of testing, clinical documentation with the electronic  health record, making appropriate referrals as documented, and communicating necessary information to the patient's healthcare team.     Bevelyn Ngo, NP 04/19/2023  11:19 AM

## 2023-04-19 NOTE — Progress Notes (Signed)
 History of Present Illness Melissa Collier is a 73 y.o. female never smoker with history of asthma, referred for lung nodule consult 04/2023 by Dr. Arelia Sneddon. Pt. Had incidental finding of a lung nodule on Calcium Scoring Ct Chest.  PMH  History of asthma - History of high blood pressure - History of hypercholesterolemia - History of mild sleep apnea - History of sciatica - History of arthritis   04/19/2023 A pleural-based lesion measuring 2.7 by 1.1 centimeters was identified on a cardiac CT scan performed in November. The lesion is unchanged compared to a prior scan. She has no history of smoking, unexplained weight loss, or hemoptysis. Her history includes stable thyroid nodules with no significant changes in recent ultrasounds.  She has asthma, which worsens during the winter and with respiratory infections. She experiences shortness of breath, particularly with physical activity and sometimes at rest, and uses an albuterol inhaler frequently, especially in the morning and evening. She describes her symptoms as 'a lot of congestion' and 'sounding like a tea kettle.' She has never used a maintenance inhaler and reports increased inhaler use over the past year. Wheezing and congestion affect her sleep and singing voice.  Her past medical history includes high blood pressure, hypercholesterolemia, mild sleep apnea, and arthritis. She has not used a CPAP machine as her sleep apnea was borderline. She has a history of acid reflux and indigestion, and joint stiffness and pain due to arthritis. She has taken prednisone in the past for sciatica but not since 2020.  Her family history is significant for heart disease, with her father, grandfather, and great-grandfather all having died from heart-related issues. Her mother and sister died of breast cancer, and her grandmothers had colon and stomach cancer, respectively.  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 She [place] in [place], [place], and has five grown children. She reports moderate alcohol use and no known allergies to latex, contrast dye, or aspirin. She works with [employer].  Test Results: 01/15/2024 Cardiac scoring Ct chest Focal area of nodular thickening in the region of the pleura of the posterolateral left hemithorax. This is of uncertain etiology and significance. No remote prior studies are available for comparison. Differential considerations include benign lesions such as a solitary fibrous tumor of the pleura, and malignant lesions. Follow-up noncontrast chest CT is recommended in 3 months to re-evaluate this lesion to ensure the stability or resolution of this finding. 2.  Aortic Atherosclerosis (ICD10-I70.0). 3. 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 Recommend annual imaging followup by CTA or MRA. This recommendation follows 2010 ACCF/AHA/AATS/ACR/ASA/SCA/SCAI/SIR/STS/SVM Guidelines for the Diagnosis and Management of Patients with Thoracic Aortic Disease. Circulation. 2010; 121: Z610-R604. Aortic aneurysm NOS (ICD10-I71.9). 4. The appearance of the liver suggests underlying cirrhosis.       Latest Ref Rng & Units 07/31/2020    3:14 AM 07/22/2020   11:29 AM 02/01/2019    2:42 AM  CBC  WBC 4.0 - 10.5 K/uL 8.6  5.1  10.8   Hemoglobin 12.0 - 15.0 g/dL 54.0  98.1  19.1   Hematocrit 36.0 - 46.0 % 31.8  39.2  35.8   Platelets 150 - 400 K/uL 201  233  199        Latest Ref Rng & Units 07/31/2020    3:14 AM 07/22/2020   11:29 AM 02/01/2019    2:42 AM  BMP  Glucose 70 - 99 mg/dL 478  295  621  BUN 8 - 23 mg/dL 14  29  17    Creatinine 0.44 - 1.00 mg/dL 8.11  9.14  7.82   Sodium 135 - 145 mmol/L 136  135  136   Potassium 3.5 - 5.1 mmol/L 3.9  4.1  3.8   Chloride 98 - 111 mmol/L 103  105  105   CO2 22 - 32 mmol/L 26  24  21    Calcium 8.9 - 10.3 mg/dL 8.8  9.5  8.8     BNP No results found for: "BNP"  ProBNP No results found for:  "PROBNP"  PFT No results found for: "FEV1PRE", "FEV1POST", "FVCPRE", "FVCPOST", "TLC", "DLCOUNC", "PREFEV1FVCRT", "PSTFEV1FVCRT"  No results found.   Past medical hx Past Medical History:  Diagnosis Date   Arthritis    Asthma    GERD (gastroesophageal reflux disease)    Hashimoto's thyroiditis    History of hiatal hernia    Hyperlipidemia    Hypertension    Hypothyroidism    Multinodular goiter    Osteopenia    Pneumonia    History of   Sciatica    Left greater than right   Sleep apnea    Mild, NO CPAP     Social History   Tobacco Use   Smoking status: Never   Smokeless tobacco: Never  Vaping Use   Vaping status: Never Used  Substance Use Topics   Alcohol use: Yes    Comment: 1-2 glasses of wine nightly    Drug use: Never    Ms.Gopaul reports that she has never smoked. She has never used smokeless tobacco. She reports current alcohol use. She reports that she does not use drugs.  Tobacco Cessation: Never smoker    Past surgical hx, Family hx, Social hx all reviewed.  Current Outpatient Medications on File Prior to Visit  Medication Sig   albuterol (VENTOLIN HFA) 108 (90 Base) MCG/ACT inhaler Inhale 2 puffs into the lungs every 4 (four) hours as needed for shortness of breath or wheezing.   amLODipine (NORVASC) 5 MG tablet Take 5 mg by mouth daily.   atorvastatin (LIPITOR) 20 MG tablet Take 20 mg by mouth at bedtime.   calcium carbonate (OS-CAL) 600 MG TABS tablet Take 600 mg by mouth daily with breakfast.   celecoxib (CELEBREX) 200 MG capsule Take 1 capsule (200 mg total) by mouth 2 (two) times daily.   ezetimibe (ZETIA) 10 MG tablet Take 1 tablet (10 mg total) by mouth daily.   levothyroxine (SYNTHROID) 75 MCG tablet Take 1 tablet (75 mcg total) by mouth daily.   omeprazole (PRILOSEC) 40 MG capsule Take 40 mg by mouth at bedtime.    valsartan-hydrochlorothiazide (DIOVAN-HCT) 320-25 MG tablet Take 1 tablet by mouth daily.   No current  facility-administered medications on file prior to visit.     Allergies  Allergen Reactions   Dust Mite Extract     Cat /  causes runny eyes    Review Of Systems:  Constitutional:   No  weight loss, night sweats,  Fevers, chills, fatigue, or  lassitude.  HEENT:   No headaches,  Difficulty swallowing,  Tooth/dental problems, or  Sore throat,                No sneezing, itching, ear ache, nasal congestion, post nasal drip,   CV:  No chest pain,  Orthopnea, PND, swelling in lower extremities, anasarca, dizziness, palpitations, syncope.   GI  No heartburn, indigestion, abdominal pain, nausea, vomiting, diarrhea, change in bowel habits,  loss of appetite, bloody stools.   Resp: + shortness of breath with exertion less at rest.  + excess mucus, no productive cough,  + non-productive cough,  No coughing up of blood.  No change in color of mucus.  + occasional  wheezing.  No chest wall deformity  Skin: no rash or lesions.  GU: no dysuria, change in color of urine, no urgency or frequency.  No flank pain, no hematuria   MS:  No joint pain or swelling.  No decreased range of motion.  No back pain.  Psych:  No change in mood or affect. No depression or anxiety.  No memory loss.   Vital Signs BP (!) 149/80 (BP Location: Left Arm, Patient Position: Sitting, Cuff Size: Normal)   Pulse 75   Temp (!) 96.9 F (36.1 C) (Temporal)   Ht 5\' 5"  (1.651 m)   Wt 161 lb (73 kg)   SpO2 97%   BMI 26.79 kg/m    Physical Exam:  General- No distress,  A&Ox3, pleasant  ENT: No sinus tenderness, TM clear, pale nasal mucosa, no oral exudate,no post nasal drip, no LAN Cardiac: S1, S2, regular rate and rhythm, no murmur Chest: No wheeze/ rales/ dullness; no accessory muscle use, no nasal flaring, no sternal retractions, slightly diminished per bases Abd.: Soft Non-tender, ND, BS +, Body mass index is 26.79 kg/m.  Ext: No clubbing cyanosis, edema, no obvious deformities Neuro:  normal strength, MAE x  4, A&O x 3, appropriate Skin: No rashes, warm and dry, no lesions  Psych: normal mood and behavior   Assessment/Plan  Incidental Lung nodule in never smoker Asthma Plan Pulmonary Nodule 2.7 x 1.1 cm pleural based lesion identified on cardiac CT. No history of smoking or unexplained weight loss. No hemoptysis. -Schedule follow-up CT scan in 3 months (due end of April 2025) to reassess size and characteristics of the nodule. - Follow up within 1-2 weeks after scan.  Asthma Increased use of Albuterol inhaler, especially during winter months and with respiratory infections. Reports of congestion and wheezing. No maintenance inhaler currently in use. -Prescribe Air Duo inhaler for use as needed for shortness of breath or wheezing. -Consider Symbicort inhaler if Air Duo proves beneficial at follow up.   I spent 25 minutes dedicated to the care of this patient on the date of this encounter to include pre-visit review of records, face-to-face time with the patient discussing conditions above, post visit ordering of testing, clinical documentation with the electronic health record, making appropriate referrals as documented, and communicating necessary information to the patient's healthcare team.    Bevelyn Ngo, NP 04/19/2023  11:22 AM

## 2023-04-23 ENCOUNTER — Encounter (HOSPITAL_COMMUNITY): Payer: Self-pay

## 2023-05-06 ENCOUNTER — Ambulatory Visit (HOSPITAL_COMMUNITY): Payer: Medicare HMO | Attending: Cardiology

## 2023-05-06 ENCOUNTER — Ambulatory Visit (HOSPITAL_BASED_OUTPATIENT_CLINIC_OR_DEPARTMENT_OTHER): Payer: Medicare HMO

## 2023-05-06 DIAGNOSIS — I77810 Thoracic aortic ectasia: Secondary | ICD-10-CM

## 2023-05-06 DIAGNOSIS — R0609 Other forms of dyspnea: Secondary | ICD-10-CM

## 2023-05-06 DIAGNOSIS — R9431 Abnormal electrocardiogram [ECG] [EKG]: Secondary | ICD-10-CM | POA: Diagnosis not present

## 2023-05-06 DIAGNOSIS — R931 Abnormal findings on diagnostic imaging of heart and coronary circulation: Secondary | ICD-10-CM | POA: Diagnosis not present

## 2023-05-06 LAB — MYOCARDIAL PERFUSION IMAGING
Angina Index: 0
Duke Treadmill Score: 7
Estimated workload: 7.7
Exercise duration (min): 6 min
Exercise duration (sec): 30 s
LV dias vol: 77 mL (ref 46–106)
LV sys vol: 30 mL
MPHR: 147 {beats}/min
Nuc Stress EF: 61 %
Peak HR: 129 {beats}/min
Percent HR: 87 %
Rest HR: 73 {beats}/min
Rest Nuclear Isotope Dose: 9.7 mCi
SDS: 2
SRS: 0
SSS: 2
ST Depression (mm): 0 mm
Stress Nuclear Isotope Dose: 30.6 mCi
TID: 0.9

## 2023-05-06 LAB — ECHOCARDIOGRAM COMPLETE
Area-P 1/2: 2.35 cm2
Height: 65 in
P 1/2 time: 636 ms
S' Lateral: 3 cm
Weight: 2592 [oz_av]

## 2023-05-06 MED ORDER — TECHNETIUM TC 99M TETROFOSMIN IV KIT
30.6000 | PACK | Freq: Once | INTRAVENOUS | Status: AC | PRN
  Administered 2023-05-06: 30.6 via INTRAVENOUS

## 2023-05-06 MED ORDER — TECHNETIUM TC 99M TETROFOSMIN IV KIT
9.7000 | PACK | Freq: Once | INTRAVENOUS | Status: AC | PRN
  Administered 2023-05-06: 9.7 via INTRAVENOUS

## 2023-05-06 NOTE — Progress Notes (Signed)
 Normal LV systolic function.  Ascending aorta is dilated dilated, was previously noted on the CT scan.  Will discuss more on office visit soon, no significant valve abnormality.  Mild aortic regurgitation.

## 2023-05-09 ENCOUNTER — Encounter: Payer: Self-pay | Admitting: Cardiology

## 2023-05-09 NOTE — Progress Notes (Signed)
 Normal stress test

## 2023-05-10 ENCOUNTER — Encounter: Payer: Self-pay | Admitting: Acute Care

## 2023-05-10 DIAGNOSIS — K7460 Unspecified cirrhosis of liver: Secondary | ICD-10-CM | POA: Diagnosis not present

## 2023-05-10 DIAGNOSIS — R7301 Impaired fasting glucose: Secondary | ICD-10-CM | POA: Diagnosis not present

## 2023-05-10 DIAGNOSIS — E039 Hypothyroidism, unspecified: Secondary | ICD-10-CM | POA: Diagnosis not present

## 2023-05-10 DIAGNOSIS — Z0001 Encounter for general adult medical examination with abnormal findings: Secondary | ICD-10-CM | POA: Diagnosis not present

## 2023-05-10 DIAGNOSIS — Z79899 Other long term (current) drug therapy: Secondary | ICD-10-CM | POA: Diagnosis not present

## 2023-05-10 DIAGNOSIS — R911 Solitary pulmonary nodule: Secondary | ICD-10-CM | POA: Diagnosis not present

## 2023-05-10 DIAGNOSIS — E7800 Pure hypercholesterolemia, unspecified: Secondary | ICD-10-CM | POA: Diagnosis not present

## 2023-05-17 ENCOUNTER — Ambulatory Visit
Admission: RE | Admit: 2023-05-17 | Discharge: 2023-05-17 | Disposition: A | Discharge status: Home / Self Care | Payer: Medicare HMO | Source: Ambulatory Visit | Attending: Acute Care | Admitting: Acute Care

## 2023-05-17 DIAGNOSIS — R918 Other nonspecific abnormal finding of lung field: Secondary | ICD-10-CM | POA: Diagnosis not present

## 2023-05-17 DIAGNOSIS — J929 Pleural plaque without asbestos: Secondary | ICD-10-CM | POA: Diagnosis not present

## 2023-05-17 DIAGNOSIS — I7121 Aneurysm of the ascending aorta, without rupture: Secondary | ICD-10-CM | POA: Diagnosis not present

## 2023-05-17 DIAGNOSIS — R911 Solitary pulmonary nodule: Secondary | ICD-10-CM

## 2023-05-17 DIAGNOSIS — J811 Chronic pulmonary edema: Secondary | ICD-10-CM | POA: Diagnosis not present

## 2023-05-19 ENCOUNTER — Other Ambulatory Visit: Payer: Self-pay | Admitting: Family Medicine

## 2023-05-19 DIAGNOSIS — K7460 Unspecified cirrhosis of liver: Secondary | ICD-10-CM

## 2023-05-25 ENCOUNTER — Encounter: Payer: Self-pay | Admitting: Acute Care

## 2023-05-25 ENCOUNTER — Ambulatory Visit: Payer: Medicare HMO | Admitting: Acute Care

## 2023-05-25 VITALS — BP 158/90 | HR 77 | Ht 65.0 in | Wt 161.6 lb

## 2023-05-25 DIAGNOSIS — R911 Solitary pulmonary nodule: Secondary | ICD-10-CM | POA: Diagnosis not present

## 2023-05-25 NOTE — Progress Notes (Signed)
 History of Present Illness Melissa Collier is a 73 y.o. female never smoker with history of asthma, referred for lung nodule consult 04/2023 by Dr. Arelia Sneddon. Pt. Had incidental finding of a lung nodule on Calcium Scoring Ct Chest 11/2-24.   PMH  History of asthma - History of high blood pressure - History of hypercholesterolemia - History of mild sleep apnea - History of sciatica - History of arthritis   05/25/2023 Pt. Presents for follow up after dedicated Ct Chest . Initial pleural based nodule was an incidental finding on a Cardiac Calcium Scoring scan done 01/2023. She does have a pulmonary history of asthma which she uses albuterol for as needed.  We have reviewed the results of her Chest CT. The pleural based nodule of concern does show stability since 01/2023. Radiology gave a differential diagnosis diagnosis  of noncalcified pleural plaque, solitary fibrous tumor of the pleura, peripheral nerve sheath tumor, extramedullary hematopoiesis, as well as malignant etiologies, which they feel are less likely. I discussed the case with Dr. Marchelle Gearing. He feels that the best option is a PET scan , which I have ordered. I will also see if we can get her on the Thoracic conference schedule for this week to discuss with the expert panel.   Pt. Remains totally asymptomatic. She is controlling her asthma with albuterol as needed . Air Supra was cost prohibitive.  Test Results: CT chest 05/17/2023 Unchanged lenticular focus of pleural thickening along the posteromedial left lower lobe. Differential includes noncalcified pleural plaque, solitary fibrous tumor of the pleura, peripheral nerve sheath tumor, extramedullary hematopoiesis, as well as malignant etiologies, although less likely, given relative indolent appearance and suggestion of chronicity. 2. Scattered unchanged pulmonary nodules, the largest measuring 8 x 4 mm in the right lower lobe. 3. Unchanged ascending thoracic aorta  measures 4.4 x 4.1 cm.  Calcium Scoring Scan 01/15/2023 2.7 x 1.1 cm pleural-based lesion in the posterior aspect of the left hemithorax (axial image 25 of series 2), unchanged in retrospect compared to the recent prior cardiac CT 01/14/2023. Within the visualized portions of the thorax there are no suspicious appearing pulmonary nodules or masses, there is no acute consolidative airspace disease, no pleural effusions, no pneumothorax and no lymphadenopathy. Atherosclerotic calcifications are noted in the thoracic aorta. Ectasia of the ascending thoracic aorta (4.3 cm in diameter). Visualized portions of the upper abdomen demonstrates a nodular contour of the liver. Multiple liver lesions are evident, measuring up to 1.8 cm in diameter in segment 2 (axial image 62 of series 2), incompletely characterized on today's noncontrast CT examination, but statistically likely to represent cysts (no imaging follow-up recommended). There are no aggressive appearing lytic or blastic lesions noted in the visualized portions of the skeleton.   IMPRESSION: 1. Focal area of nodular thickening in the region of the pleura of the posterolateral left hemithorax. This is of uncertain etiology and significance. No remote prior studies are available for comparison. Differential considerations include benign lesions such as a solitary fibrous tumor of the pleura, and malignant lesions. Follow-up noncontrast chest CT is recommended in 3 months to re-evaluate this lesion to ensure the stability or resolution of this finding. 2.  Aortic Atherosclerosis (ICD10-I70.0). 3. 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 Recommend annual imaging followup by CTA or MRA. This recom    Latest Ref Rng & Units 07/31/2020    3:14 AM 07/22/2020   11:29 AM 02/01/2019    2:42 AM  CBC  WBC 4.0 -  10.5 K/uL 8.6  5.1  10.8   Hemoglobin 12.0 - 15.0 g/dL 84.1  32.4  40.1   Hematocrit 36.0 - 46.0 % 31.8  39.2  35.8    Platelets 150 - 400 K/uL 201  233  199        Latest Ref Rng & Units 07/31/2020    3:14 AM 07/22/2020   11:29 AM 02/01/2019    2:42 AM  BMP  Glucose 70 - 99 mg/dL 027  253  664   BUN 8 - 23 mg/dL 14  29  17    Creatinine 0.44 - 1.00 mg/dL 4.03  4.74  2.59   Sodium 135 - 145 mmol/L 136  135  136   Potassium 3.5 - 5.1 mmol/L 3.9  4.1  3.8   Chloride 98 - 111 mmol/L 103  105  105   CO2 22 - 32 mmol/L 26  24  21    Calcium 8.9 - 10.3 mg/dL 8.8  9.5  8.8     BNP No results found for: "BNP"  ProBNP No results found for: "PROBNP"  PFT No results found for: "FEV1PRE", "FEV1POST", "FVCPRE", "FVCPOST", "TLC", "DLCOUNC", "PREFEV1FVCRT", "PSTFEV1FVCRT"  CT CHEST WO CONTRAST Result Date: 05/25/2023 CLINICAL DATA:  Further evaluation of focal pleural thickening along the posteromedial left lower lobe EXAM: CT CHEST WITHOUT CONTRAST TECHNIQUE: Multidetector CT imaging of the chest was performed following the standard protocol without IV contrast. RADIATION DOSE REDUCTION: This exam was performed according to the departmental dose-optimization program which includes automated exposure control, adjustment of the mA and/or kV according to patient size and/or use of iterative reconstruction technique. COMPARISON:  Cardiac CT dated 01/15/2023 FINDINGS: Cardiovascular: Normal heart size. No significant pericardial fluid/thickening. Unchanged ascending thoracic aorta measures 4.4 x 4.1 cm (2:74). Coronary artery calcifications and aortic atherosclerosis. Mediastinum/Nodes: Thyroid nodules, previously evaluated. Small hiatal hernia. No pathologically enlarged axillary, supraclavicular, mediastinal, or hilar lymph nodes. Lungs/Pleura: The central airways are patent. Scattered unchanged pulmonary nodules, for example peripheral right lower lobe nodule measures 8 x 4 mm (5:76), peripheral left lower lobe 4 mm nodule (5:91), and subpleural right lower lobe 2 mm nodules (5:87, 91). Additional 3 mm subpleural left upper  lobe nodule (5:40). Unchanged subpleural tree-in-bud nodules along the anterior right middle lobe, which may reflect scarring. Punctate calcified granulomata along the basilar left lower lobe. No focal consolidation. No pneumothorax. No pleural effusion. Unchanged lenticular focus of pleural thickening along the posteromedial left lower lobe measuring 2.7 x 1.1 cm (2:82). Upper abdomen: Scattered hepatic hypodensities, for example 1.0 cm in segment 2 (2:136), incompletely characterized, but most likely cysts. Musculoskeletal: No acute or abnormal lytic or blastic osseous lesions. Apparent cortical thickening of the medial left posterior 9th rib adjacent to the area of pleural thickening. (2:85). Sclerotic focus in the right glenoid (3:94), likely a bone island. IMPRESSION: 1. Unchanged lenticular focus of pleural thickening along the posteromedial left lower lobe. Differential includes noncalcified pleural plaque, solitary fibrous tumor of the pleura, peripheral nerve sheath tumor, extramedullary hematopoiesis, as well as malignant etiologies, although less likely, given relative indolent appearance and suggestion of chronicity. 2. Scattered unchanged pulmonary nodules, the largest measuring 8 x 4 mm in the right lower lobe. 3. Unchanged ascending thoracic aorta measures 4.4 x 4.1 cm. Recommend annual imaging followup by CTA or MRA. This recommendation follows 2010 ACCF/AHA/AATS/ACR/ASA/SCA/SCAI/SIR/STS/SVM Guidelines for the Diagnosis and Management of Patients with Thoracic Aortic Disease. Circulation. 2010; 121: D638-V564. Aortic aneurysm NOS (ICD10-I71.9) 4. Aortic Atherosclerosis (ICD10-I70.0). Coronary artery calcifications.  Assessment for potential risk factor modification, dietary therapy or pharmacologic therapy may be warranted, if clinically indicated. Electronically Signed   By: Agustin Cree M.D.   On: 05/25/2023 15:06   MYOCARDIAL PERFUSION IMAGING Result Date: 05/06/2023   The study is normal. The study  is low risk.   No ST deviation was noted.   LV perfusion is normal. There is no evidence of ischemia. There is no evidence of infarction.   Left ventricular function is normal. Nuclear stress EF: 61%. The left ventricular ejection fraction is normal (55-65%). End diastolic cavity size is normal. End systolic cavity size is normal.   Prior study available for comparison. Unable to view 2014 study. Normal study.  Low risk study.   ECHOCARDIOGRAM COMPLETE Result Date: 05/06/2023    ECHOCARDIOGRAM REPORT   Patient Name:   VALERIE CONES Date of Exam: 05/06/2023 Medical Rec #:  [id number]             Height:       65.0 in Accession #:    [id number]            Weight:       162.0 lb Date of Birth:  May 10, 1950              BSA:          1.809 m Patient Age:    72 years              BP:           140/80 mmHg Patient Gender: F                     HR:           62 bpm. Exam Location:  [address] Procedure: 2D Echo (Both Spectral and Color Flow Doppler were utilized during            procedure). Indications:    R94.31 Abnormal EKG                 R06.00 Dyspnea  History:        Patient has no prior history of Echocardiogram examinations.                 Risk Factors:Hypertension, Dyslipidemia and Sleep Apnea.  Sonographer:    Sedonia Small Rodgers-Jones RDCS Referring Phys: [id number] IMPRESSIONS  1. Left ventricular ejection fraction, by estimation, is 60 to 65%. The left ventricle has normal function. The left ventricle has no regional wall motion abnormalities. Left ventricular diastolic parameters are consistent with Grade I diastolic dysfunction (impaired relaxation). The average left ventricular global longitudinal strain is -25.4 %. The global longitudinal strain is normal.  2. Right ventricular systolic function is normal. The right ventricular size is normal.  3. Left atrial size was mildly dilated.  4. The mitral valve is normal in structure. Mild mitral valve regurgitation. No evidence of mitral stenosis.  5.  The aortic valve is tricuspid. There is mild calcification of the aortic valve. Aortic valve regurgitation is mild. No aortic stenosis is present. Aortic regurgitation PHT measures 636 msec.  6. Aortic dilatation noted. There is mild dilatation of the ascending aorta, measuring 42 mm.  7. The inferior vena cava is normal in size with greater than 50% respiratory variability, suggesting right atrial pressure of 3 mmHg. FINDINGS  Left Ventricle: Left ventricular ejection fraction, by estimation, is 60 to 65%. The left ventricle has normal function. The left ventricle has no  regional wall motion abnormalities. The average left ventricular global longitudinal strain is -25.4 %. Strain was performed and the global longitudinal strain is normal. The left ventricular internal cavity size was normal in size. There is no left ventricular hypertrophy. Left ventricular diastolic parameters are consistent with Grade I diastolic dysfunction (impaired relaxation). Right Ventricle: The right ventricular size is normal. No increase in right ventricular wall thickness. Right ventricular systolic function is normal. Left Atrium: Left atrial size was mildly dilated. Right Atrium: Right atrial size was normal in size. Pericardium: There is no evidence of pericardial effusion. Mitral Valve: The mitral valve is normal in structure. Mild mitral valve regurgitation. No evidence of mitral valve stenosis. Tricuspid Valve: The tricuspid valve is normal in structure. Tricuspid valve regurgitation is trivial. No evidence of tricuspid stenosis. The aortic valve is tricuspid. There is mild calcification of the aortic valve. Aortic valve regurgitation is mild. No aortic stenosis is present. Pulmonic Valve: The pulmonic valve was normal in structure. Pulmonic valve regurgitation is mild. No evidence of pulmonic stenosis. Aorta: Aortic dilatation noted. There is mild dilatation of the ascending aorta, measuring 42 mm. Venous: The inferior vena cava  is normal in size with greater than 50% respiratory variability, suggesting right atrial pressure of 3 mmHg. IAS/Shunts: No atrial level shunt detected by color flow Doppler. Additional Comments: 3D was performed not requiring image post processing on an independent workstation and was normal.  LEFT VENTRICLE PLAX 2D LVIDd:         4.60 cm   Diastology LVIDs:         3.00 cm   LV e' medial:    4.74 cm/s LV PW:         0.90 cm   LV E/e' medial:  9.4 LV IVS:        0.90 cm   LV e' lateral:   5.28 cm/s LVOT diam:     1.80 cm   LV E/e' lateral: 8.4 LV SV:         57 LV SV Index:   31        2D Longitudinal Strain LVOT Area:     2.54 cm  2D Strain GLS (A4C):   -25.2 %                          2D Strain GLS (A3C):   -25.4 %                          2D Strain GLS (A2C):   -25.7 %                          2D Strain GLS Avg:     -25.4 %                           3D Volume EF:                          3D EF:        56 %                          LV EDV:       160 ml  LV ESV:       71 ml                          LV SV:        89 ml RIGHT VENTRICLE             IVC RV Basal diam:  4.00 cm     IVC diam: 1.40 cm RV S prime:     16.10 cm/s TAPSE (M-mode): 2.6 cm LEFT ATRIUM             Index        RIGHT ATRIUM           Index LA diam:        4.50 cm 2.49 cm/m   RA Area:     12.60 cm LA Vol (A2C):   53.3 ml 29.47 ml/m  RA Volume:   29.20 ml  16.14 ml/m LA Vol (A4C):   50.6 ml 27.98 ml/m LA Biplane Vol: 53.7 ml 29.69 ml/m  AORTIC VALVE LVOT Vmax:   88.85 cm/s LVOT Vmean:  58.650 cm/s LVOT VTI:    0.222 m AI PHT:      636 msec  AORTA Ao Root diam: 3.50 cm Ao Asc diam:  4.20 cm MITRAL VALVE               TRICUSPID VALVE MV Area (PHT): 2.35 cm    TR Peak grad:   22.5 mmHg MV Decel Time: 323 msec    TR Vmax:        237.00 cm/s MV E velocity: 44.55 cm/s MV A velocity: 87.25 cm/s  SHUNTS MV E/A ratio:  0.51        Systemic VTI:  0.22 m                            Systemic Diam: 1.80 cm Arvilla Meres MD  Electronically signed by Arvilla Meres MD Signature Date/Time: 05/06/2023/11:46:46 AM    Final      Past medical hx Past Medical History:  Diagnosis Date   Arthritis    Asthma    GERD (gastroesophageal reflux disease)    Hashimoto's thyroiditis    History of hiatal hernia    Hyperlipidemia    Hypertension    Hypothyroidism    Multinodular goiter    Osteopenia    Pneumonia    History of   Sciatica    Left greater than right   Sleep apnea    Mild, NO CPAP     Social History   Tobacco Use   Smoking status: Never    Passive exposure: Never   Smokeless tobacco: Never  Vaping Use   Vaping status: Never Used  Substance Use Topics   Alcohol use: Yes    Comment: 1-2 glasses of wine nightly    Drug use: Never    Ms.Crayton reports that she has never smoked. She has never been exposed to tobacco smoke. She has never used smokeless tobacco. She reports current alcohol use. She reports that she does not use drugs.  Tobacco Cessation: Never smoker    Past surgical hx, Family hx, Social hx all reviewed.  Current Outpatient Medications on File Prior to Visit  Medication Sig   albuterol (VENTOLIN HFA) 108 (90 Base) MCG/ACT inhaler Inhale 2 puffs into the lungs every 4 (four) hours as needed for shortness of breath or wheezing.   amLODipine (  NORVASC) 5 MG tablet Take 5 mg by mouth daily.   atorvastatin (LIPITOR) 20 MG tablet Take 20 mg by mouth at bedtime.   calcium carbonate (OS-CAL) 600 MG TABS tablet Take 600 mg by mouth daily with breakfast.   celecoxib (CELEBREX) 200 MG capsule Take 1 capsule (200 mg total) by mouth 2 (two) times daily.   ezetimibe (ZETIA) 10 MG tablet Take 1 tablet (10 mg total) by mouth daily.   levothyroxine (SYNTHROID) 75 MCG tablet Take 1 tablet (75 mcg total) by mouth daily.   omeprazole (PRILOSEC) 40 MG capsule Take 40 mg by mouth at bedtime.    valsartan-hydrochlorothiazide (DIOVAN-HCT) 320-25 MG tablet Take 1 tablet by mouth daily.    Fluticasone-Salmeterol,sensor, (AIRDUO DIGIHALER) 113-14 MCG/ACT AEPB Inhale 1-2 puffs into the lungs as needed. (Patient not taking: Reported on 05/25/2023)   No current facility-administered medications on file prior to visit.     Allergies  Allergen Reactions   Dust Mite Extract     Cat /  causes runny eyes    Review Of Systems:  Constitutional:   No  weight loss, night sweats,  Fevers, chills, fatigue, or  lassitude.  HEENT:   No headaches,  Difficulty swallowing,  Tooth/dental problems, or  Sore throat,                No sneezing, itching, ear ache, nasal congestion, post nasal drip,   CV:  No chest pain,  Orthopnea, PND, swelling in lower extremities, anasarca, dizziness, palpitations, syncope.   GI  No heartburn, indigestion, abdominal pain, nausea, vomiting, diarrhea, change in bowel habits, loss of appetite, bloody stools.   Resp: No shortness of breath with exertion or at rest.  No excess mucus, no productive cough,  No non-productive cough,  No coughing up of blood.  No change in color of mucus.  No wheezing.  No chest wall deformity  Skin: no rash or lesions.  GU: no dysuria, change in color of urine, no urgency or frequency.  No flank pain, no hematuria   MS:  No joint pain or swelling.  No decreased range of motion.  No back pain.  Psych:  No change in mood or affect. No depression or anxiety.  No memory loss.   Vital Signs BP (!) 158/90 (BP Location: Left Arm, Patient Position: Sitting, Cuff Size: Normal)   Pulse 77   Ht 5\' 5"  (1.651 m)   Wt 161 lb 9.6 oz (73.3 kg)   SpO2 97%   BMI 26.89 kg/m    Physical Exam:  General- No distress,  A&Ox3, pleasant ENT: No sinus tenderness, TM clear, pale nasal mucosa, no oral exudate,no post nasal drip, no LAN Cardiac: S1, S2, regular rate and rhythm, no murmur Chest: No wheeze/ rales/ dullness; no accessory muscle use, no nasal flaring, no sternal retractions Abd.: Soft Non-tender, ND, BS +, Body mass index is 26.89  kg/m.  Ext: No clubbing cyanosis, edema Neuro:  normal strength, MAE x 4, A&O x 3, appropriate Skin: No rashes, warm and dry, no obvious lesions  Psych: normal mood and behavior   Assessment/Plan Stable Pleural based pulmonary nodule Never smoker  Plan Your CT Chest shows the left pleural based nodule is stable and has not grown in size.  We have discussed doing a PET scan to ensure it is not growing. You will get a call to get this scheduled. You will follow up with me about 1 week after the scan to review the results. Based on results,  we will determine next best steps in plan of care.  Call if you need [place] sooner. Please contact office for sooner follow up if symptoms do not improve or worsen or seek emergency care    I spent 15 minutes dedicated to the care of this patient on the date of this encounter to include pre-visit review of records, face-to-face time with the patient discussing conditions above, post visit ordering of testing, clinical documentation with the electronic health record, making appropriate referrals as documented, and communicating necessary information to the patient's healthcare team.   Bevelyn Ngo, NP 05/25/2023  4:10 PM

## 2023-05-25 NOTE — Patient Instructions (Addendum)
 It is good to see you today.  Your CT Chest shows the left pleural based nodule is stable and has not grown in size.  We have discussed doing a PET scan to ensure it is not growing, and may not be a malignancy. You will get a call to get this scheduled. You will follow up with me about 1 week after the scan to review the results. Based on results, we will determine next best steps in plan of care.  Call if you need [place] sooner. Please contact office for sooner follow up if symptoms do not improve or worsen or seek emergency care

## 2023-05-27 ENCOUNTER — Ambulatory Visit
Admission: RE | Admit: 2023-05-27 | Discharge: 2023-05-27 | Disposition: A | Discharge status: Home / Self Care | Source: Ambulatory Visit | Attending: Family Medicine | Admitting: Family Medicine

## 2023-05-27 DIAGNOSIS — K7460 Unspecified cirrhosis of liver: Secondary | ICD-10-CM

## 2023-05-27 DIAGNOSIS — K769 Liver disease, unspecified: Secondary | ICD-10-CM | POA: Diagnosis not present

## 2023-05-31 ENCOUNTER — Encounter (HOSPITAL_COMMUNITY)
Admission: RE | Admit: 2023-05-31 | Discharge: 2023-05-31 | Disposition: A | Discharge status: Home / Self Care | Source: Ambulatory Visit | Attending: Acute Care | Admitting: Acute Care

## 2023-05-31 DIAGNOSIS — R911 Solitary pulmonary nodule: Secondary | ICD-10-CM | POA: Diagnosis not present

## 2023-05-31 DIAGNOSIS — R918 Other nonspecific abnormal finding of lung field: Secondary | ICD-10-CM | POA: Diagnosis not present

## 2023-05-31 LAB — GLUCOSE, CAPILLARY: Glucose-Capillary: 108 mg/dL — ABNORMAL HIGH (ref 70–99)

## 2023-05-31 MED ORDER — FLUDEOXYGLUCOSE F - 18 (FDG) INJECTION
8.0000 | Freq: Once | INTRAVENOUS | Status: AC
  Administered 2023-05-31: 8 via INTRAVENOUS

## 2023-06-07 ENCOUNTER — Encounter: Payer: Self-pay | Admitting: Acute Care

## 2023-06-07 ENCOUNTER — Ambulatory Visit (INDEPENDENT_AMBULATORY_CARE_PROVIDER_SITE_OTHER): Admitting: Acute Care

## 2023-06-07 VITALS — BP 173/80 | HR 59 | Ht 65.0 in | Wt 160.8 lb

## 2023-06-07 DIAGNOSIS — R0609 Other forms of dyspnea: Secondary | ICD-10-CM

## 2023-06-07 DIAGNOSIS — R911 Solitary pulmonary nodule: Secondary | ICD-10-CM | POA: Diagnosis not present

## 2023-06-07 NOTE — Patient Instructions (Addendum)
 It is good to see you today.  Your PET scan was negative for any significant hypermetabolism on PET. This is good news. We will do 3 month follow up CT Chest.  This will be due 08/2023. You will get a call to get this scheduled closer to the time.  Follow up with me after the scan to review results. I will give you the financial assistance paperwork to see if we can get Air Supra for a better price.  Use 1-2 puffs as needed  for shortness of breath or wheezing. Rinse mouth after use Call if you need [place] sooner. Please contact office for sooner follow up if symptoms do not improve or worsen or seek emergency care

## 2023-06-07 NOTE — Progress Notes (Signed)
 History of Present Illness Melissa Collier is a 73 y.o. female never smoker with asthma, referred by Dr. Arelia Sneddon for an incidental lung nodule  04/2023. She will be folowed by Dr. Delton Coombes .   PMH - History of asthma - History of high blood pressure - History of hypercholesterolemia - History of mild sleep apnea - History of sciatica - History of arthritis   06/07/2023 Pt. Presents for follow up after PET scan to review results. She states she has been doing well. She was last seen 04/19/2023 after referral for an abnormal lung nodule noted on a calcium scoring CT Chest. PET scan was ordered at that visit. She is here to follow up.  We have reviewed her PET scan results . They are negative for hypermetabolism. We will do a 3 month follow up scan due 08/2023 as surveillance.  Pt. States she does still have some shortness of breath with exertion. I am providing her with some paperwork with [place] [employer] [place] for financial assistance to see if she qualifies for the Air Supra to use as needed.   We discussed that her blood pressure was elevated today in the office. She was away this weekend at a horse show and had not taken her medication. I have asked her to go home and take her medication . She states she will do this, and verbalized understanding of increased risk of stroke from elevated BP.   Test Results: PET Scan 05/31/2023  Small scattered pulmonary nodules are below limits of PET sensitivity. No hypermetabolism is demonstrated. Stable left basilar pleural lesion. 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  Small scattered pulmonary nodules are below limits of PET sensitivity. No hypermetabolism is demonstrated. 2. No findings for hypermetabolic metastatic disease involving the neck, chest, abdomen/pelvis or bony structures. 3. 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 4. Diffuse FDG uptake in the thyroid gland suggesting thyroiditis and stable thyroid goiter.   CT chest 05/17/2023 Unchanged lenticular focus of pleural thickening along the posteromedial left lower lobe. Differential includes noncalcified pleural plaque, solitary fibrous tumor of the pleura, peripheral nerve sheath tumor, extramedullary hematopoiesis, as well as malignant etiologies, although less likely, given relative indolent appearance and suggestion of chronicity. 2. Scattered unchanged pulmonary nodules, the largest measuring 8 x 4 mm in the right lower lobe. 3. Unchanged ascending thoracic aorta measures 4.4 x 4.1 cm.   Calcium Scoring Scan 01/15/2023 2.7 x 1.1 cm pleural-based lesion in the posterior aspect of the left hemithorax (axial image 25 of series 2), unchanged in retrospect compared to the recent prior cardiac CT 01/14/2023. Within the visualized portions of the thorax there are no suspicious appearing pulmonary nodules or masses, there is no acute consolidative airspace disease, no pleural effusions, no pneumothorax and no lymphadenopathy. Atherosclerotic calcifications are noted in the thoracic aorta. Ectasia of the ascending thoracic aorta (4.3 cm in diameter). Visualized portions of the upper abdomen demonstrates a nodular contour of the liver. Multiple liver lesions are evident, measuring up to 1.8 cm in diameter in segment 2 (axial image 62 of series 2), incompletely characterized on today's noncontrast CT examination, but statistically likely to represent cysts (no imaging follow-up recommended). There are no aggressive appearing lytic or blastic lesions noted in the visualized portions of the skeleton.   IMPRESSION:  1. Focal area of nodular thickening in the region of the pleura of the posterolateral left hemithorax. This is of uncertain etiology and  significance. No remote prior studies are available for comparison. Differential considerations include benign lesions such as a solitary fibrous tumor of the pleura, and malignant lesions. Follow-up noncontrast chest CT is recommended in 3 months to re-evaluate this lesion to ensure the stability or resolution of this finding. 2.  Aortic Atherosclerosis (ICD10-I70.0). 3. 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     Latest Ref Rng & Units 07/31/2020    3:14 AM 07/22/2020   11:29 AM 02/01/2019    2:42 AM  CBC  WBC 4.0 - 10.5 K/uL 8.6  5.1  10.8   Hemoglobin 12.0 - 15.0 g/dL 16.1  09.6  04.5   Hematocrit 36.0 - 46.0 % 31.8  39.2  35.8   Platelets 150 - 400 K/uL 201  233  199        Latest Ref Rng & Units 07/31/2020    3:14 AM 07/22/2020   11:29 AM 02/01/2019    2:42 AM  BMP  Glucose 70 - 99 mg/dL 409  811  914   BUN 8 - 23 mg/dL 14  29  17    Creatinine 0.44 - 1.00 mg/dL 7.82  9.56  2.13   Sodium 135 - 145 mmol/L 136  135  136   Potassium 3.5 - 5.1 mmol/L 3.9  4.1  3.8   Chloride 98 - 111 mmol/L 103  105  105   CO2 22 - 32 mmol/L 26  24  21    Calcium 8.9 - 10.3 mg/dL 8.8  9.5  8.8     BNP No results found for: "BNP"  ProBNP No results found for: "PROBNP"  PFT No results found for: "FEV1PRE", "FEV1POST", "FVCPRE", "FVCPOST", "TLC", "DLCOUNC", "PREFEV1FVCRT", "PSTFEV1FVCRT"  NM PET Image Initial (PI) Skull Base To Thigh Result Date: 06/04/2023 CLINICAL DATA:  Initial treatment strategy for pulmonary nodules. EXAM: NUCLEAR MEDICINE PET SKULL BASE TO THIGH TECHNIQUE: 8.0 mCi F-18 FDG was injected intravenously. Full-ring PET imaging was performed from the skull base to thigh after the radiotracer. CT data was obtained and used for attenuation correction and anatomic localization. Fasting blood glucose: 108 mg/dl COMPARISON:  Chest CT 08/65/7846 FINDINGS: Mediastinal blood pool activity: SUV max 2.26 Liver activity: SUV max NA NECK: No hypermetabolic lymph nodes in the  neck. Diffuse FDG uptake in the thyroid gland and stable thyroid goiter. Findings suggest thyroiditis. Incidental CT findings: None. CHEST: Small scattered pulmonary nodules are below limits of PET sensitivity. No hypermetabolism is demonstrated. Stable left basilar pleural lesion. 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 No enlarged or hypermetabolic mediastinal or hilar lymph nodes. No hypermetabolic breast lesions, supraclavicular or axillary adenopathy. Incidental CT findings: Stable atherosclerotic calcifications involving the aorta and branch vessels. ABDOMEN/PELVIS: No abnormal hypermetabolic activity within the liver, pancreas, adrenal glands, or spleen. No hypermetabolic lymph nodes in the abdomen or pelvis. Incidental CT findings: Scattered benign hepatic cysts. Advanced atherosclerotic calcifications involving the aorta and iliac arteries but no aneurysm. Sigmoid colon diverticulosis. SKELETON: No focal hypermetabolic activity to suggest skeletal metastasis. Incidental CT findings: None. IMPRESSION: 1. Small scattered pulmonary nodules are below limits of PET sensitivity. No hypermetabolism is demonstrated. 2. No findings for hypermetabolic metastatic disease involving the neck, chest, abdomen/pelvis or bony structures. 3. 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 4. Diffuse FDG uptake in the thyroid gland suggesting thyroiditis and stable thyroid goiter. Electronically Signed   By: Rudie Meyer M.D.   On: 06/04/2023 12:54   US Abdomen Limited RUQ (LIVER/GB) Result Date: 05/27/2023 CLINICAL DATA:  Liver cirrhosis. EXAM: ULTRASOUND ABDOMEN LIMITED RIGHT UPPER QUADRANT COMPARISON:  None Available. FINDINGS: Gallbladder: No gallstones or wall thickening visualized. No sonographic Murphy  sign noted by sonographer. Common bile duct: Diameter: 3.3 mm. Liver: Increased echotexture. Liver cysts are identified, largest measures 1.4 x 0.8 x 1.7 cm in the right lobe liver. There is a 1.4 x 0.8 x 1.4 cm septated left lobe liver cyst. Portal vein is patent on color Doppler imaging with normal direction of blood flow towards the liver. Other: None. IMPRESSION: 1. No acute abnormality identified. 2. Increased echotexture of the liver, nonspecific but can be seen in fatty infiltration of liver. Electronically Signed   By: Sherian Rein M.D.   On: 05/27/2023 09:48   CT CHEST WO CONTRAST Result Date: 05/25/2023 CLINICAL DATA:  Further evaluation of focal pleural thickening along the posteromedial left lower lobe EXAM: CT CHEST WITHOUT CONTRAST TECHNIQUE: Multidetector CT imaging of the chest was performed following the standard protocol without IV contrast. RADIATION DOSE REDUCTION: This exam was performed according to the departmental dose-optimization program which includes automated exposure control, adjustment of the mA and/or kV according to patient size and/or use of iterative reconstruction technique. COMPARISON:  Cardiac CT dated 01/15/2023 FINDINGS: Cardiovascular: Normal heart size. No significant pericardial fluid/thickening. Unchanged ascending thoracic aorta measures 4.4 x 4.1 cm (2:74). Coronary artery calcifications and aortic atherosclerosis. Mediastinum/Nodes: Thyroid nodules, previously evaluated. Small hiatal hernia. No pathologically enlarged axillary, supraclavicular, mediastinal, or hilar lymph nodes. Lungs/Pleura: The central airways are patent. Scattered unchanged pulmonary nodules, for example peripheral right lower lobe nodule measures 8 x 4 mm (5:76), peripheral left lower lobe 4 mm nodule (5:91), and subpleural right lower lobe 2 mm nodules (5:87, 91). Additional 3 mm subpleural left upper lobe nodule (5:40). Unchanged subpleural tree-in-bud nodules along the anterior right middle  lobe, which may reflect scarring. Punctate calcified granulomata along the basilar left lower lobe. No focal consolidation. No pneumothorax. No pleural effusion. Unchanged lenticular focus of pleural thickening along the posteromedial left lower lobe measuring 2.7 x 1.1 cm (2:82). Upper abdomen: Scattered hepatic hypodensities, for example 1.0 cm in segment 2 (2:136), incompletely characterized, but most likely cysts. Musculoskeletal: No acute or abnormal lytic or blastic osseous lesions. Apparent cortical thickening of the medial left posterior 9th rib adjacent to the area of pleural thickening. (2:85). Sclerotic focus in the right glenoid (3:94), likely a bone island. IMPRESSION: 1. Unchanged lenticular focus of pleural thickening along the posteromedial left lower lobe. Differential includes noncalcified pleural plaque, solitary fibrous tumor of the pleura, peripheral nerve sheath tumor, extramedullary hematopoiesis, as well as malignant etiologies, although less likely, given relative indolent appearance and suggestion of chronicity. 2. Scattered unchanged pulmonary nodules, the largest measuring 8 x 4 mm in the right lower lobe. 3. Unchanged ascending thoracic aorta measures 4.4 x 4.1 cm. Recommend annual imaging followup by CTA or MRA. This recommendation follows 2010 ACCF/AHA/AATS/ACR/ASA/SCA/SCAI/SIR/STS/SVM Guidelines for the Diagnosis and Management of Patients with Thoracic Aortic Disease. Circulation. 2010; 121: Z610-R604. Aortic aneurysm NOS (ICD10-I71.9) 4. Aortic Atherosclerosis (ICD10-I70.0). Coronary artery calcifications. Assessment for potential risk factor modification, dietary therapy or pharmacologic therapy may be warranted, if clinically  indicated. Electronically Signed   By: Agustin Cree M.D.   On: 05/25/2023 15:06     Past medical hx Past Medical History:  Diagnosis Date   Arthritis    Asthma    GERD (gastroesophageal reflux disease)    Hashimoto's thyroiditis    History of hiatal  hernia    Hyperlipidemia    Hypertension    Hypothyroidism    Multinodular goiter    Osteopenia    Pneumonia    History of   Sciatica    Left greater than right   Sleep apnea    Mild, NO CPAP     Social History   Tobacco Use   Smoking status: Never    Passive exposure: Never   Smokeless tobacco: Never  Vaping Use   Vaping status: Never Used  Substance Use Topics   Alcohol use: Yes    Comment: 1-2 glasses of wine nightly    Drug use: Never    Ms.Selleck reports that she has never smoked. She has never been exposed to tobacco smoke. She has never used smokeless tobacco. She reports current alcohol use. She reports that she does not use drugs.  Tobacco Cessation: Counseling given: Not Answered Never Smoker  Past surgical hx, Family hx, Social hx all reviewed.  Current Outpatient Medications on File Prior to Visit  Medication Sig   albuterol (VENTOLIN HFA) 108 (90 Base) MCG/ACT inhaler Inhale 2 puffs into the lungs every 4 (four) hours as needed for shortness of breath or wheezing.   amLODipine (NORVASC) 5 MG tablet Take 5 mg by mouth daily.   atorvastatin (LIPITOR) 20 MG tablet Take 20 mg by mouth at bedtime.   calcium carbonate (OS-CAL) 600 MG TABS tablet Take 600 mg by mouth daily with breakfast.   celecoxib (CELEBREX) 200 MG capsule Take 1 capsule (200 mg total) by mouth 2 (two) times daily.   ezetimibe (ZETIA) 10 MG tablet Take 1 tablet (10 mg total) by mouth daily.   levothyroxine (SYNTHROID) 75 MCG tablet Take 1 tablet (75 mcg total) by mouth daily.   omeprazole (PRILOSEC) 40 MG capsule Take 40 mg by mouth at bedtime.    valsartan-hydrochlorothiazide (DIOVAN-HCT) 320-25 MG tablet Take 1 tablet by mouth daily.   Fluticasone-Salmeterol,sensor, (AIRDUO DIGIHALER) 113-14 MCG/ACT AEPB Inhale 1-2 puffs into the lungs as needed. (Patient not taking: Reported on 06/07/2023)   No current facility-administered medications on file prior to visit.     Allergies  Allergen  Reactions   Dust Mite Extract     Cat /  causes runny eyes    Review Of Systems:  Constitutional:   No  weight loss, night sweats,  Fevers, chills, fatigue, or  lassitude.  HEENT:   No headaches,  Difficulty swallowing,  Tooth/dental problems, or  Sore throat,                No sneezing, itching, ear ache, nasal congestion, post nasal drip,   CV:  No chest pain,  Orthopnea, PND, swelling in lower extremities, anasarca, dizziness, palpitations, syncope.   GI  No heartburn, indigestion, abdominal pain, nausea, vomiting, diarrhea, change in bowel habits, loss of appetite, bloody stools.   Resp: + shortness of breath with exertion or at rest.  No excess mucus, no productive cough,  +  non-productive cough,  No coughing up of blood.  No change in color of mucus.  No wheezing.  No chest wall deformity  Skin: no rash or lesions.  GU: no dysuria, change in  color of urine, no urgency or frequency.  No flank pain, no hematuria   MS:  No joint pain or swelling.  No decreased range of motion.  No back pain.  Psych:  No change in mood or affect. No depression or anxiety.  No memory loss.   Vital Signs BP (!) 173/80 (BP Location: Left Arm, Patient Position: Sitting, Cuff Size: Normal)   Pulse (!) 59   Ht 5\' 5"  (1.651 m)   Wt 160 lb 12.8 oz (72.9 kg)   SpO2 98%   BMI 26.76 kg/m    Physical Exam:  General- No distress,  A&Ox3, pleasant ENT: No sinus tenderness, TM clear, pale nasal mucosa, no oral exudate,no post nasal drip, no LAN Cardiac: S1, S2, regular rate and rhythm, no murmur Chest: No wheeze/ rales/ dullness; no accessory muscle use, no nasal flaring, no sternal retractions Abd.: Soft Non-tender, ND, BS +, Body mass index is 26.76 kg/m.  Ext: No clubbing cyanosis, edema, no obvious deformities Neuro:  normal strength, MAE x 4, A&O x 3, Appropriate Skin: No rashes, warm and dry, no obvious lesions  Psych: normal mood and behavior,appropriate   Assessment/Plan Incidental  Lung nodule in never smoker Asthma Pulmonary Nodule 2.7 x 1.1 cm pleural based lesion identified on cardiac CT. No history of smoking or unexplained weight loss. No hemoptysis. Plan - PET scan negative for hypermetabolic activity -Schedule follow-up CT scan in 3 months (July 2025) to reassess size and characteristics of the nodule after negative PET.    Asthma Increased use of Albuterol inhaler, especially during winter months and with respiratory infections. Reports of congestion and wheezing. No maintenance inhaler currently in use. - Prescribed Air Supra  inhaler for use as needed for shortness of breath or wheezing. - Consider Symbicort inhaler if Air Duo proves beneficial at follow up. - Provided with paperwork for financial assistance as it is cost prohibitive     I spent 15 minutes dedicated to the care of this patient on the date of this encounter to include pre-visit review of records, face-to-face time with the patient discussing conditions above, post visit ordering of testing, clinical documentation with the electronic health record, making appropriate referrals as documented, and communicating necessary information to the patient's healthcare team.     Bevelyn Ngo, NP 06/07/2023  4:02 PM

## 2023-06-09 ENCOUNTER — Other Ambulatory Visit: Payer: Self-pay | Admitting: Acute Care

## 2023-06-09 DIAGNOSIS — R0609 Other forms of dyspnea: Secondary | ICD-10-CM

## 2023-06-09 MED ORDER — AIRSUPRA 90-80 MCG/ACT IN AERO: 1.0000 | INHALATION_SPRAY | Freq: Two times a day (BID) | RESPIRATORY_TRACT | 6 refills | Status: AC | PRN

## 2023-06-09 NOTE — Progress Notes (Signed)
 [name] Kitchen

## 2023-06-11 ENCOUNTER — Encounter: Payer: Self-pay | Admitting: Cardiology

## 2023-06-11 ENCOUNTER — Ambulatory Visit: Payer: Medicare HMO | Attending: Cardiology | Admitting: Cardiology

## 2023-06-11 VITALS — BP 126/80 | HR 71 | Resp 16 | Ht 65.0 in | Wt 157.8 lb

## 2023-06-11 DIAGNOSIS — R931 Abnormal findings on diagnostic imaging of heart and coronary circulation: Secondary | ICD-10-CM

## 2023-06-11 DIAGNOSIS — E7800 Pure hypercholesterolemia, unspecified: Secondary | ICD-10-CM | POA: Diagnosis not present

## 2023-06-11 DIAGNOSIS — I7121 Aneurysm of the ascending aorta, without rupture: Secondary | ICD-10-CM

## 2023-06-11 NOTE — Patient Instructions (Addendum)
 Medication Instructions:  Your physician recommends that you continue on your current medications as directed. Please refer to the Current Medication list given to you today.  *If you need a refill on your cardiac medications before your next appointment, please call your pharmacy*  Lab Work: none If you have labs (blood work) drawn today and your tests are completely normal, you will receive your results only by: MyChart Message (if you have MyChart) OR A paper copy in the mail If you have any lab test that is abnormal or we need to change your treatment, we will call you to review the results.  Testing/Procedures: Your physician has requested that you have an echocardiogram in one year. Echocardiography is a painless test that uses sound waves to create images of your heart. It provides your doctor with information about the size and shape of your heart and how well your heart's chambers and valves are working. This procedure takes approximately one hour. There are no restrictions for this procedure. Please do NOT wear cologne, perfume, aftershave, or lotions (deodorant is allowed). Please arrive 15 minutes prior to your appointment time.  Please note: We ask at that you not bring children with you during ultrasound (echo/ vascular) testing. Due to room size and safety concerns, children are not allowed in the ultrasound rooms during exams. Our front office staff cannot provide observation of children in our lobby area while testing is being conducted. An adult accompanying a patient to their appointment will only be allowed in the ultrasound room at the discretion of the ultrasound technician under special circumstances. We apologize for any inconvenience.   Follow-Up: At [facility], you and your health needs are our priority.  As part of our continuing mission to provide you with exceptional heart care, our providers are all part of one team.  This team includes your primary  Cardiologist (physician) and Advanced Practice Providers or APPs (Physician Assistants and Nurse Practitioners) who all work together to provide you with the care you need, when you need it.  Your next appointment:   12 month(s)  after echo  Provider:   Yates Decamp, MD     We recommend signing up for the patient portal called "MyChart".  Sign up information is provided on this After Visit Summary.  MyChart is used to connect with patients for Virtual Visits (Telemedicine).  Patients are able to view lab/test results, encounter notes, upcoming appointments, etc.  Non-urgent messages can be sent to your provider as well.   To learn more about what you can do with MyChart, go to [email or web address].   Other Instructions        1st Floor: - Lobby - Registration  - Pharmacy  - Lab - Cafe  2nd Floor: - PV Lab - Diagnostic Testing (echo, CT, nuclear med)  3rd Floor: - Vacant  4th Floor: - TCTS (cardiothoracic surgery) - AFib Clinic - Structural Heart Clinic - Vascular Surgery  - Vascular Ultrasound  5th Floor: - [facility] (general and EP) - Clinical Pharmacy for coumadin, hypertension, lipid, weight-loss medications, and med management appointments    Valet parking services will be available as well.

## 2023-06-11 NOTE — Progress Notes (Signed)
 Cardiology Office Note:  .   Date:  06/11/2023  ID:  Melissa Collier, DOB 08/09/1950, MRN [id number] PCP: Lanae Pinal, MD  [facility] Providers Cardiologist:  Knox Perl, MD   History of Present Illness: .   Melissa Collier is a 73 y.o. 73 year old female who presents for cardiology evaluation. She was referred by Dr. John McComb for evaluation of elevated coronary calcium score and ascending aorta showing aneurysm at 4.3 cm.  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   Discussed the use of AI scribe software for clinical note transcription with the patient, who gave verbal consent to proceed.  The patient, with a history of an ascending aortic aneurysm and elevated coronary calcium score, presents for a follow-up visit after a recent exercise stress test. The test was normal, and the patient reports good exercise capacity, working hard outside with horses.  Labs   External Labs:  Care Everywhere/PCP labs 04/30/2022:   Total cholesterol 209, triglycerides 128, HDL 48, LDL 138.   A1c 5.6%.  TSH normal at 1.18.   Serum glucose 1 and 10 mg, BUN 16, creatinine 0.76, EGFR 83 mL, potassium 4.1.  Review of Systems  Cardiovascular:  Positive for dyspnea on exertion. Negative for chest pain and leg swelling.   Physical Exam:   VS:  There were no vitals taken for this visit.   Wt Readings from Last 3 Encounters:  06/07/23 160 lb 12.8 oz (72.9 kg)  05/25/23 161 lb 9.6 oz (73.3 kg)  05/06/23 162 lb (73.5 kg)    Physical Exam Neck:     Vascular: No carotid bruit or JVD.  Cardiovascular:     Rate and Rhythm: Normal rate and regular rhythm.     Heart sounds: Normal heart sounds. No murmur heard.    No gallop.  Pulmonary:     Effort: Pulmonary effort is normal.     Breath sounds: Normal breath sounds.  Abdominal:     General: Bowel sounds are normal.     Palpations: Abdomen is soft.   Musculoskeletal:     Right lower leg: No edema.     Left lower leg: No edema.    Studies Reviewed: Aaron Aas    Coronary calcium score 01/18/2023 Total Agatston coronary calcium score 94.7. MESA database percentile 64. LM: 0 LAD: 0 LCx: 5.88 RCA: 88.8 Visualized ascending and descending aorta mildly dilated and ectatic at 4.3, aortic atherosclerosis. Extracardiac abnormalities: Pulmonary nodule Appearance of the liver suggest underlying cirrhosis.   MYOCARDIAL PERFUSION IMAGING 05/06/2023    The study is normal. The study is low risk.   No ST deviation was noted. Patient exercised for 6 min and 30 sec. Maximum HR of 129 bpm. MPHR 87.0%. Peak METS 7.7.    LV perfusion is normal. There is no evidence of ischemia. There is no evidence of infarction.   Left ventricular function is normal. Nuclear stress EF: 61%. The left ventricular ejection fraction is normal (55-65%). End diastolic cavity size is normal. End systolic cavity size is normal.    Prior study available for comparison  ECHOCARDIOGRAM COMPLETE 05/06/2023  1. Left ventricular ejection fraction, by estimation, is 60 to 65%. The left ventricle has normal function. The left ventricle has no regional wall motion abnormalities. Left ventricular diastolic parameters are consistent with Grade I diastolic dysfunction (impaired relaxation). The average left ventricular global longitudinal strain is -25.4 %. The global  longitudinal strain is normal. 2. Right ventricular systolic function is normal. The right ventricular size is normal. 3. Left atrial size was mildly dilated. 4. The mitral valve is normal in structure. Mild mitral valve regurgitation. No evidence of mitral stenosis. 5. The aortic valve is tricuspid. There is mild calcification of the aortic valve. Aortic valve regurgitation is mild. No aortic stenosis is present. Aortic regurgitation PHT measures 636 msec. 6. Aortic dilatation noted. There is mild dilatation of the ascending aorta,  measuring 42 mm. 7. The inferior vena cava is normal in size with greater than 50% respiratory variability, suggesting right atrial pressure of 3 mmHg.  EKG:         Medications and allergies    Allergies  Allergen Reactions   Dust Mite Extract     Cat /  causes runny eyes     Current Outpatient Medications:    albuterol (VENTOLIN HFA) 108 (90 Base) MCG/ACT inhaler, Inhale 2 puffs into the lungs every 4 (four) hours as needed for shortness of breath or wheezing., Disp: , Rfl:    Albuterol-Budesonide (AIRSUPRA) 90-80 MCG/ACT AERO, Inhale 1-2 Inhalations into the lungs 2 (two) times daily as needed., Disp: 90 g, Rfl: 6   amLODipine (NORVASC) 5 MG tablet, Take 5 mg by mouth daily., Disp: , Rfl:    atorvastatin (LIPITOR) 20 MG tablet, Take 20 mg by mouth at bedtime., Disp: , Rfl:    calcium carbonate (OS-CAL) 600 MG TABS tablet, Take 600 mg by mouth daily with breakfast., Disp: , Rfl:    celecoxib (CELEBREX) 200 MG capsule, Take 1 capsule (200 mg total) by mouth 2 (two) times daily., Disp: 60 capsule, Rfl: 0   ezetimibe (ZETIA) 10 MG tablet, Take 1 tablet (10 mg total) by mouth daily., Disp: 90 tablet, Rfl: 2   Fluticasone-Salmeterol,sensor, (AIRDUO DIGIHALER) 113-14 MCG/ACT AEPB, Inhale 1-2 puffs into the lungs as needed. (Patient not taking: Reported on 06/07/2023), Disp: 1 each, Rfl: 2   levothyroxine (SYNTHROID) 75 MCG tablet, Take 1 tablet (75 mcg total) by mouth daily., Disp: , Rfl:    omeprazole (PRILOSEC) 40 MG capsule, Take 40 mg by mouth at bedtime. , Disp: , Rfl:    valsartan-hydrochlorothiazide (DIOVAN-HCT) 320-25 MG tablet, Take 1 tablet by mouth daily., Disp: , Rfl:    No orders of the defined types were placed in this encounter.    There are no discontinued medications.   ASSESSMENT AND PLAN: .      ICD-10-CM   1. Elevated coronary artery calcium score 01/14/2023: Total score 94.7, 64th percentile.  R93.1     2. Hypercholesteremia  E78.00     3. Aortic root  dilatation (HCC)  I77.810       1. Elevated coronary artery calcium score 01/14/2023: Total score 94.7, 64th percentile. (Primary) Her coronary calcium score is in the 64th percentile, indicating coronary artery calcification, a risk factor for coronary artery disease. It is not overly concerning at this time. Continue current management and monitoring of cardiovascular risk factors.  She is presently on Lipitor 20 mg daily, she will need follow-up lipid profile testing, if LDL is not <100, consider adding Zetia.  Will send my note to her PCP.  With regard to dyspnea related to reactive airway disease, I reviewed the results of the echocardiogram and the stress test and reassured her.  Does not appear to be cardiac in etiology.  2. Hypercholesteremia As stated above she is on high intensity statin at moderate dose 20 mg of  atorvastatin.  3. Aneurysm of ascending aorta without rupture (HCC) Mild dilatation of the ascending aorta discussed with the patient performed on echocardiogram, will repeat echocardiogram in a year.  If it remains stable probably will consider repeating echocardiogram every 2 to 3 years.  She is presently on ARB, valsartan HCT which affords protection and hence continue the same.  Blood pressure is also well-controlled on present medical therapy.  I will see her back in a year.    Assessment and Plan Assessment & Plan          Signed,  Knox Perl, MD, [facility] 06/11/2023, 6:41 AM [facility] [address] [place], [place] [address] Phone: [phone]. Fax:  [phone]

## 2023-06-21 IMAGING — US US THYROID
1 series · 13 of 25 positions shown · non-contrast
Comparison: Most recent prior thyroid ultrasound 11/24/2019

CLINICAL DATA: Goiter. Right mid thyroid nodule previously biopsied
in 3228

EXAM:
THYROID ULTRASOUND
TECHNIQUE: Ultrasound examination of the thyroid gland and adjacent soft
tissues was performed.

[Series 1: us thyroid · 13 of 56 slices shown]
[im 1/56]
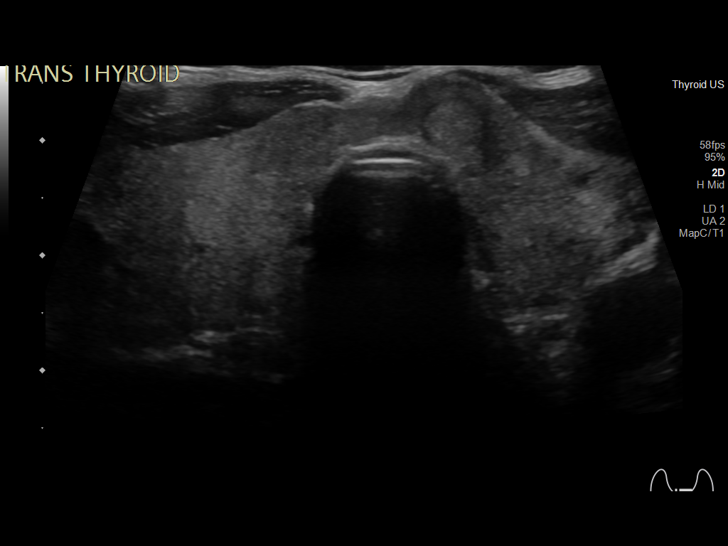
[im 5/56]
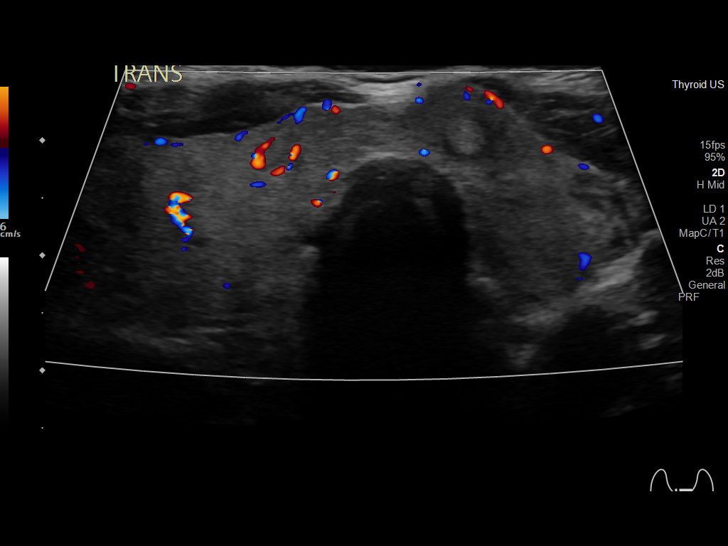
[im 10/56]
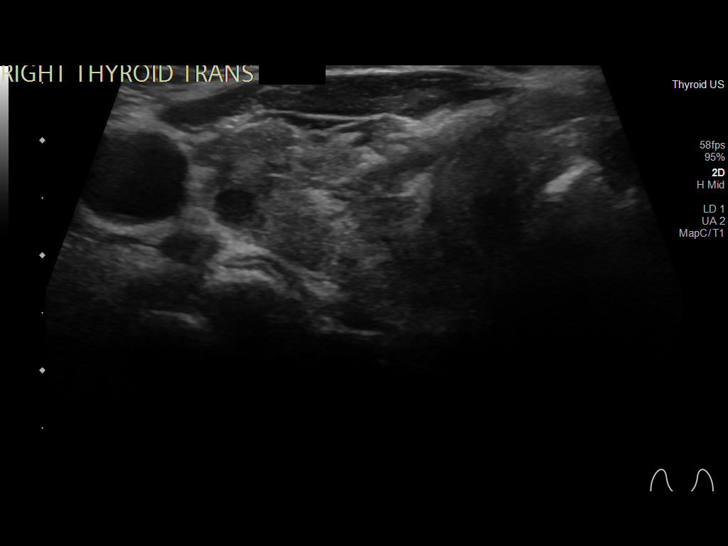
[im 14/56]
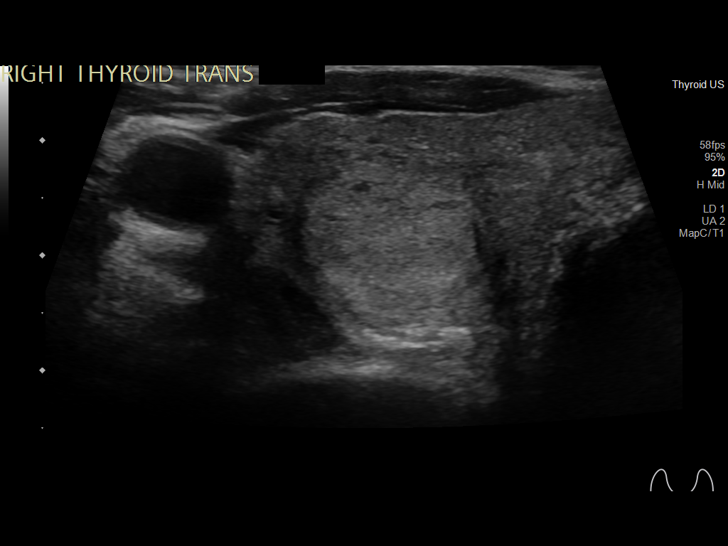
[im 19/56]
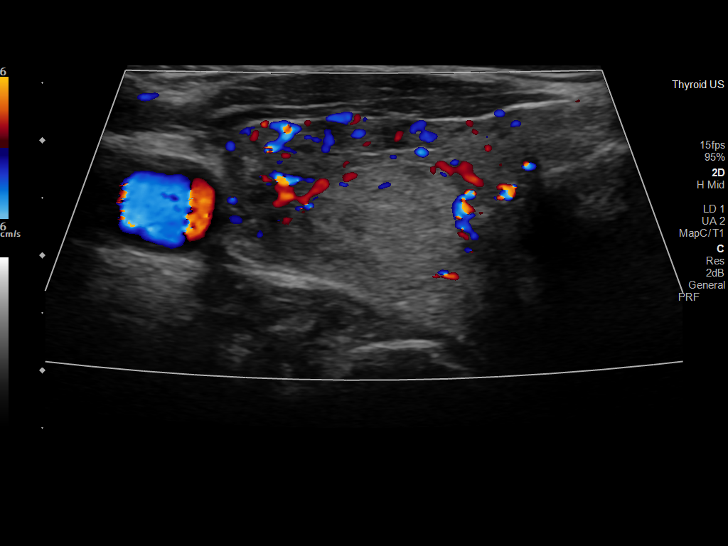
[im 23/56]
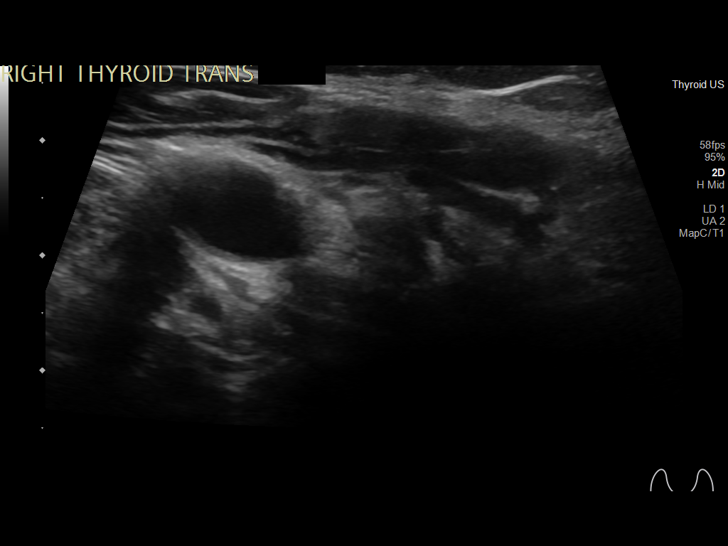
[im 28/56]
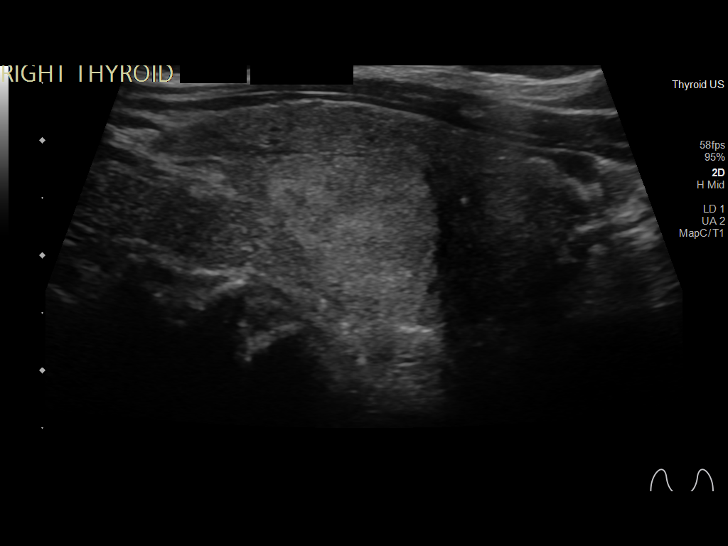
[im 33/56]
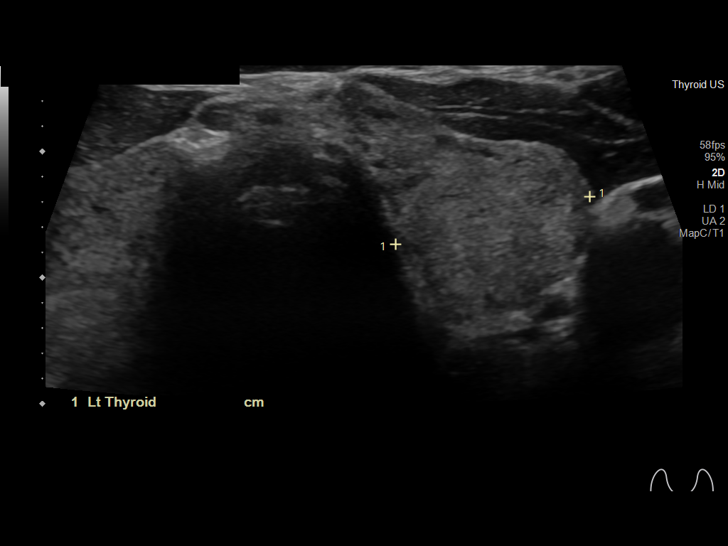
[im 37/56]
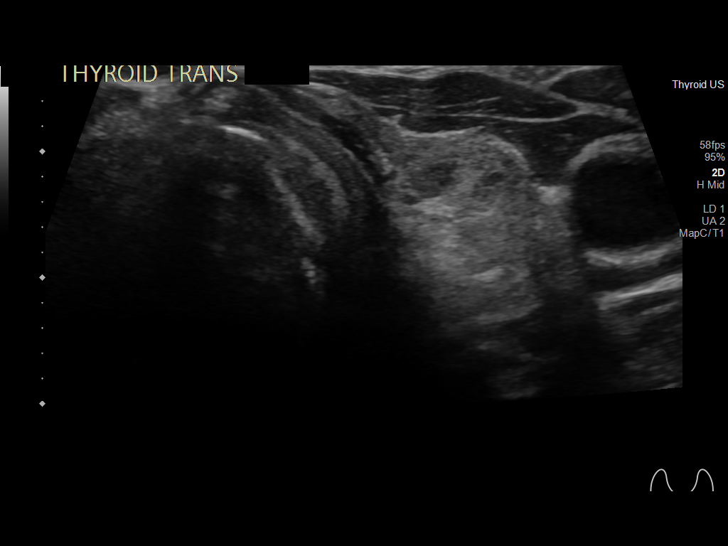
[im 42/56]
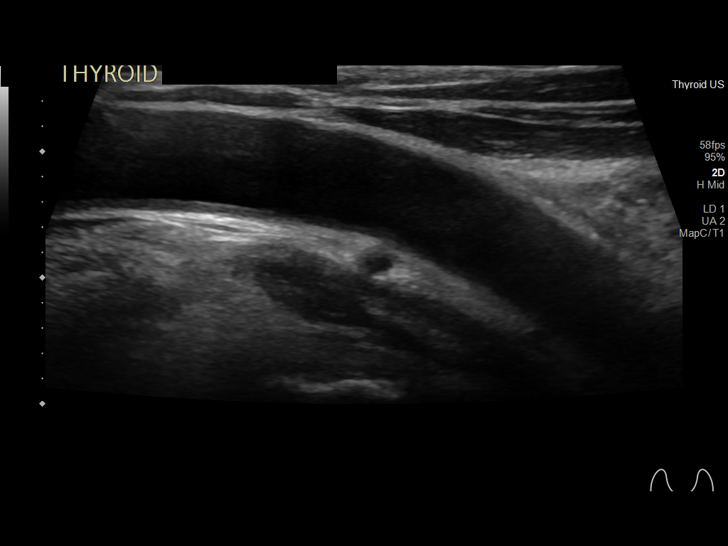
[im 46/56]
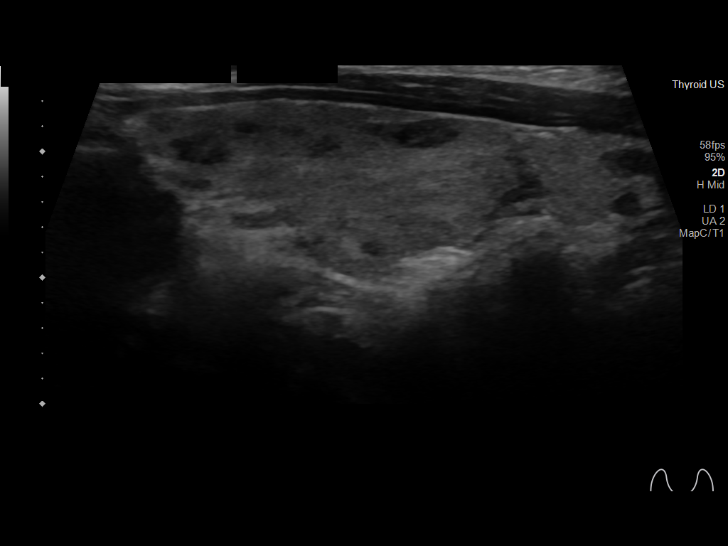
[im 51/56]
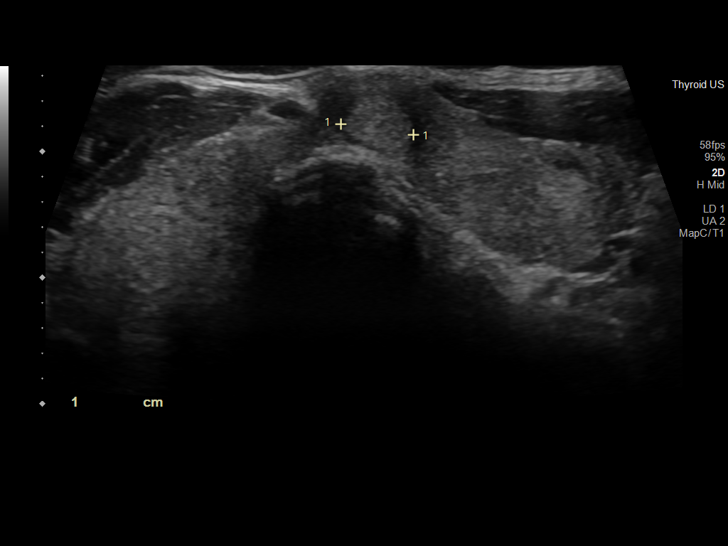
[im 56/56]
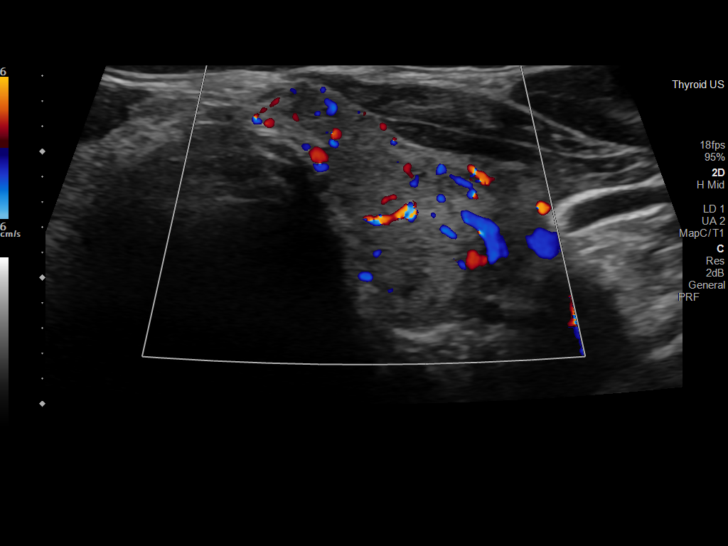

[13 of 25 positions shown; findings below may reference images not displayed]

FINDINGS: Parenchymal Echotexture: Mildly heterogenous

Isthmus: 0.4 cm

Right lobe: 3.8 x 2.1 x 2.1 cm

Left lobe: 3.7 x 1.5 x 1.6 cm

_________________________________________________________

Estimated total number of nodules >/= 1 cm: 1

Number of spongiform nodules >/=  2 cm not described below (TR1): 0

Number of mixed cystic and solid nodules >/= 1.5 cm not described
below (TR2): 0

_________________________________________________________

Nodule # 1: The previously biopsied nodule in the right mid gland
remains essentially unchanged at 1.8 x 1.4 x 1.5 cm. This nodule was
previously biopsied in 3228 and found to be benign.

No new nodules or suspicious features identified. Incidental note is
made of a subcentimeter isoechoic solid nodule in the left mid gland
which warrants no further consideration.
IMPRESSION: Continued stability of previously biopsied nodule in the right mid
gland.

No new nodules or suspicious features.

## 2023-08-31 DIAGNOSIS — I10 Essential (primary) hypertension: Secondary | ICD-10-CM | POA: Diagnosis not present

## 2023-09-06 DIAGNOSIS — X32XXXD Exposure to sunlight, subsequent encounter: Secondary | ICD-10-CM | POA: Diagnosis not present

## 2023-09-06 DIAGNOSIS — I10 Essential (primary) hypertension: Secondary | ICD-10-CM | POA: Diagnosis not present

## 2023-09-06 DIAGNOSIS — L570 Actinic keratosis: Secondary | ICD-10-CM | POA: Diagnosis not present

## 2023-09-20 ENCOUNTER — Encounter: Payer: Self-pay | Admitting: Acute Care

## 2023-09-22 ENCOUNTER — Other Ambulatory Visit

## 2023-09-23 ENCOUNTER — Ambulatory Visit
Admission: RE | Admit: 2023-09-23 | Discharge: 2023-09-23 | Disposition: A | Discharge status: Home / Self Care | Source: Ambulatory Visit | Attending: Acute Care | Admitting: Acute Care

## 2023-09-23 DIAGNOSIS — R911 Solitary pulmonary nodule: Secondary | ICD-10-CM | POA: Diagnosis not present

## 2023-09-23 DIAGNOSIS — I700 Atherosclerosis of aorta: Secondary | ICD-10-CM | POA: Diagnosis not present

## 2023-09-29 DIAGNOSIS — I10 Essential (primary) hypertension: Secondary | ICD-10-CM | POA: Diagnosis not present

## 2023-09-30 DIAGNOSIS — I10 Essential (primary) hypertension: Secondary | ICD-10-CM | POA: Diagnosis not present

## 2023-09-30 DIAGNOSIS — J4520 Mild intermittent asthma, uncomplicated: Secondary | ICD-10-CM | POA: Diagnosis not present

## 2023-09-30 DIAGNOSIS — E7800 Pure hypercholesterolemia, unspecified: Secondary | ICD-10-CM | POA: Diagnosis not present

## 2023-09-30 DIAGNOSIS — E039 Hypothyroidism, unspecified: Secondary | ICD-10-CM | POA: Diagnosis not present

## 2023-10-12 ENCOUNTER — Ambulatory Visit: Admitting: Acute Care

## 2023-10-12 ENCOUNTER — Encounter: Payer: Self-pay | Admitting: Acute Care

## 2023-10-12 VITALS — BP 137/108 | HR 73 | Ht 65.0 in | Wt 161.8 lb

## 2023-10-12 DIAGNOSIS — J45909 Unspecified asthma, uncomplicated: Secondary | ICD-10-CM

## 2023-10-12 DIAGNOSIS — R918 Other nonspecific abnormal finding of lung field: Secondary | ICD-10-CM

## 2023-10-12 DIAGNOSIS — R9389 Abnormal findings on diagnostic imaging of other specified body structures: Secondary | ICD-10-CM

## 2023-10-12 DIAGNOSIS — R911 Solitary pulmonary nodule: Secondary | ICD-10-CM

## 2023-10-12 DIAGNOSIS — J4520 Mild intermittent asthma, uncomplicated: Secondary | ICD-10-CM

## 2023-10-12 NOTE — Patient Instructions (Addendum)
 It is good to see you today. Your CT Chest shows stable lung nodules. This is great news. We will do a 6 month follow up CT Chest to follow for stability. This will be due 03/2024. You will get a call to get this scheduled closer to the time . You will follow up with me 1-2 weeks after the scan has been completed to review results. Call for any unexplained weight loss or blood in your sputum. Continue using Air Supra as needed for shortness of breath or wheezing.  Call if you need maintenance inhaler. I will see you in 6 months. Please contact office for sooner follow up if symptoms do not improve or worsen or seek emergency care

## 2023-10-12 NOTE — Progress Notes (Signed)
 History of Present Illness Melissa Collier is a 73 y.o. female  never smoker with asthma, referred by Dr. McComb for an incidental lung nodule  04/2023. She will be followed by Dr. Shelah .   PMH - History of asthma - History of high blood pressure - History of hypercholesterolemia - History of mild sleep apnea - History of sciatica - History of arthritis  Family history of breast cancer in her mother, and stomach cancer and leukemia in her  maternal grandmother died at 62 ,colon cancer In paternal grandmother, died in late 17's.  2023/10/17 Discussed the use of AI scribe software for clinical note transcription with the patient, who gave verbal consent to proceed.  History of Present Illness  Melissa Collier is a 73 year old female who presents for follow-up imaging of stable pulmonary nodules.  She has a history of stable pulmonary nodules. A recent CT scan on September 23, 2023, showed a 5 mm nodule in the right lower lobe and a 4 mm nodule in the left lower lobe, both of which are stable. There are no new nodules, pneumothorax, pleural effusion, chest wall mass, or suspicious bone lesions. Previous imaging in March showed scattered unchanged pulmonary nodules, with the largest being 4 mm in the right lower lobe. A calcium  scoring scan previously showed a 2.7 x 1.1 cm area, which is no longer present in follow-up scans. A PET scan showed very small nodules with no abnormal uptake and a stable left basilar pleural lesion with low FDG uptake.  She has asthma and uses an Airsupra  inhaler as needed for shortness of breath or wheezing, particularly in the winter.  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  Family history includes various cancers: her mother died of breast cancer at age 48, diagnosed at 5; her maternal grandmother had  stomach cancer and chronic leukemia, diagnosed at 20 and died at 14; her paternal grandmother had colon cancer and died in her sixties; her father had prostate cancer.  She denies any new respiratory symptoms     Test Results: Expand All Collapse All   Test Results: PET Scan 05/31/2023  Small scattered pulmonary nodules are below limits of PET sensitivity. No hypermetabolism is demonstrated. Stable left basilar pleural lesion. 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   Small scattered pulmonary nodules are below limits of PET sensitivity. No hypermetabolism is demonstrated. 2. No findings for hypermetabolic metastatic disease involving the neck, chest, abdomen/pelvis or bony structures. 3. 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 4. Diffuse FDG uptake in the thyroid  gland suggesting thyroiditis and stable thyroid  goiter.   CT chest 05/17/2023 Unchanged lenticular focus of pleural thickening along the posteromedial left lower lobe. Differential includes noncalcified pleural plaque, solitary fibrous tumor of the pleura, peripheral nerve sheath tumor, extramedullary hematopoiesis, as well as malignant etiologies, although less likely, given relative indolent appearance and suggestion of chronicity. 2. Scattered unchanged pulmonary nodules, the largest measuring 8 x 4 mm in the right lower lobe. 3. Unchanged ascending thoracic aorta measures 4.4 x 4.1 cm.   Calcium  Scoring Scan 01/15/2023 2.7 x 1.1 cm pleural-based lesion in the posterior aspect  of the left hemithorax (axial image 25 of series 2), unchanged in retrospect compared to the recent prior cardiac CT 01/14/2023. Within the visualized portions of the thorax there are no suspicious appearing pulmonary  nodules or masses, there is no acute consolidative airspace disease, no pleural effusions, no pneumothorax and no lymphadenopathy. Atherosclerotic calcifications are noted in the thoracic aorta. Ectasia of the ascending thoracic aorta (4.3 cm in diameter). Visualized portions of the upper abdomen demonstrates a nodular contour of the liver. Multiple liver lesions are evident, measuring up to 1.8 cm in diameter in segment 2 (axial image 62 of series 2), incompletely characterized on today's noncontrast CT examination, but statistically likely to represent cysts (no imaging follow-up recommended). There are no aggressive appearing lytic or blastic lesions noted in the visualized portions of the skeleton.   IMPRESSION: 1. Focal area of nodular thickening in the region of the pleura of the posterolateral left hemithorax. This is of uncertain etiology and significance. No remote prior studies are available for comparison. Differential considerations include benign lesions such as a solitary fibrous tumor of the pleura, and malignant lesions. Follow-up noncontrast chest CT is recommended in 3 months to re-evaluate this lesion to ensure the stability or resolution of this finding. 2.  Aortic Atherosclerosis (ICD10-I70.0). 3. 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       Latest Ref Rng & Units 07/31/2020    3:14 AM 07/22/2020   11:29 AM 02/01/2019    2:42 AM  CBC  WBC 4.0 - 10.5 K/uL 8.6  5.1  10.8   Hemoglobin 12.0 - 15.0 g/dL 89.3  86.9  88.1   Hematocrit 36.0 - 46.0 % 31.8  39.2  35.8   Platelets 150 - 400 K/uL 201  233  199        Latest Ref Rng & Units 07/31/2020    3:14 AM 07/22/2020   11:29 AM 02/01/2019    2:42 AM  BMP  Glucose 70 - 99 mg/dL 869  896  856   BUN 8 - 23 mg/dL 14  29  17    Creatinine 0.44 - 1.00 mg/dL 9.26  9.16  9.22   Sodium 135 - 145 mmol/L 136  135  136   Potassium 3.5 - 5.1 mmol/L 3.9  4.1  3.8   Chloride 98 - 111 mmol/L 103  105  105    CO2 22 - 32 mmol/L 26  24  21    Calcium  8.9 - 10.3 mg/dL 8.8  9.5  8.8     BNP No results found for: BNP  ProBNP No results found for: PROBNP  PFT No results found for: FEV1PRE, FEV1POST, FVCPRE, FVCPOST, TLC, DLCOUNC, PREFEV1FVCRT, PSTFEV1FVCRT  CT CHEST WO CONTRAST Result Date: 09/29/2023 CLINICAL DATA:  Nodules EXAM: CT CHEST WITHOUT CONTRAST TECHNIQUE: Multidetector CT imaging of the chest was performed following the standard protocol without IV contrast. RADIATION DOSE REDUCTION: This exam was performed according to the departmental dose-optimization program which includes automated exposure control, adjustment of the mA and/or kV according to patient size and/or use of iterative reconstruction technique. COMPARISON:  05/17/2023. FINDINGS: Cardiovascular: Ascending thoracic aorta aneurysm maximal diameter 4.3 cm. Atheromatous calcifications aorta and coronary arteries. No cardiomegaly or pericardial effusion. Mediastinum/Nodes: No enlarged mediastinal or axillary lymph nodes. Thyroid  gland, trachea, and esophagus demonstrate no significant findings. Lungs/Pleura: Right lower lobe 5 mm nodule stable. Left lower lobe 4 mm nodule also stable. No new nodules. No pneumothorax or pleural effusion. Minimal dependent bibasilar subsegmental atelectasis.  Upper Abdomen: Small hiatal hernia. Musculoskeletal: No chest wall mass or suspicious bone lesions identified. IMPRESSION: 1. Stable pulmonary nodules. 2. Aortic and coronary artery atherosclerosis (ICD10-I70.0). 3. Small hiatal hernia. Electronically Signed   By: Fonda Field M.D.   On: 09/29/2023 22:48     Past medical hx Past Medical History:  Diagnosis Date   Arthritis    Asthma    GERD (gastroesophageal reflux disease)    Hashimoto's thyroiditis    History of hiatal hernia    Hyperlipidemia    Hypertension    Hypothyroidism    Multinodular goiter    Osteopenia    Pneumonia    History of   Sciatica    Left  greater than right   Sleep apnea    Mild, NO CPAP     Social History   Tobacco Use   Smoking status: Never    Passive exposure: Never   Smokeless tobacco: Never  Vaping Use   Vaping status: Never Used  Substance Use Topics   Alcohol use: Yes    Comment: 1-2 glasses of wine nightly    Drug use: Never    Melissa Collier reports that she has never smoked. She has never been exposed to tobacco smoke. She has never used smokeless tobacco. She reports current alcohol use. She reports that she does not use drugs.  Tobacco Cessation: Counseling given: Not Answered Never smoker  Past surgical hx, Family hx, Social hx all reviewed.  Current Outpatient Medications on File Prior to Visit  Medication Sig   albuterol  (VENTOLIN  HFA) 108 (90 Base) MCG/ACT inhaler Inhale 2 puffs into the lungs every 4 (four) hours as needed for shortness of breath or wheezing.   Albuterol -Budesonide (AIRSUPRA ) 90-80 MCG/ACT AERO Inhale 1-2 Inhalations into the lungs 2 (two) times daily as needed.   amLODipine  (NORVASC ) 5 MG tablet Take 5 mg by mouth daily.   atorvastatin  (LIPITOR) 20 MG tablet Take 20 mg by mouth at bedtime.   calcium  carbonate (OS-CAL) 600 MG TABS tablet Take 600 mg by mouth daily with breakfast.   celecoxib  (CELEBREX ) 200 MG capsule Take 1 capsule (200 mg total) by mouth 2 (two) times daily.   Fluticasone-Salmeterol,sensor, (AIRDUO DIGIHALER ) 113-14 MCG/ACT AEPB Inhale 1-2 puffs into the lungs as needed.   levothyroxine  (SYNTHROID ) 75 MCG tablet Take 1 tablet (75 mcg total) by mouth daily.   omeprazole  (PRILOSEC) 40 MG capsule Take 40 mg by mouth at bedtime.    valsartan -hydrochlorothiazide  (DIOVAN -HCT) 320-25 MG tablet Take 1 tablet by mouth daily.   No current facility-administered medications on file prior to visit.     Allergies  Allergen Reactions   Dust Mite Extract     Cat /  causes runny eyes    Review Of Systems:  Constitutional:   No  weight loss, night sweats,  Fevers,  chills, fatigue, or  lassitude.  HEENT:   No headaches,  Difficulty swallowing,  Tooth/dental problems, or  Sore throat,                No sneezing, itching, ear ache, nasal congestion, post nasal drip,   CV:  No chest pain,  Orthopnea, PND, swelling in lower extremities, anasarca, dizziness, palpitations, syncope.   GI  No heartburn, indigestion, abdominal pain, nausea, vomiting, diarrhea, change in bowel habits, loss of appetite, bloody stools.   Resp: No shortness of breath with exertion or at rest.  No excess mucus, no productive cough,  No non-productive cough,  No coughing up of blood.  No change in color of mucus.  No wheezing.  No chest wall deformity  Skin: no rash or lesions.  GU: no dysuria, change in color of urine, no urgency or frequency.  No flank pain, no hematuria   MS:  No joint pain or swelling.  No decreased range of motion.  No back pain.  Psych:  No change in mood or affect. No depression or anxiety.  No memory loss.   Vital Signs BP (!) 137/108   Pulse 73   Ht 5' 5 (1.651 m)   Wt 161 lb 12.8 oz (73.4 kg)   SpO2 98% Comment: RA  BMI 26.92 kg/m    Physical Exam:  General- No distress,  A&Ox3, pleasant ENT: No sinus tenderness, TM clear, pale nasal mucosa, no oral exudate,no post nasal drip, no LAN Cardiac: S1, S2, regular rate and rhythm, no murmur Chest: No wheeze/ rales/ dullness; no accessory muscle use, no nasal flaring, no sternal retractions Abd.: Soft Non-tender, nondistended, bowel sounds positive,Body mass index is 26.92 kg/m.  Ext: No clubbing cyanosis, edema, no obvious deformities Neuro:  normal strength, moving all extremities x 4, alert and oriented x 3 Skin: No rashes, warm and dry, no obvious skin lesions Psych: normal mood and behavior   Assessment/Plan Incidental Lung nodule in never smoker Asthma Pulmonary Nodule 2.7 x 1.1 cm pleural based lesion identified on cardiac CT. No history of smoking or unexplained weight loss. No  hemoptysis. - PET scan negative for hypermetabolic activity Plan  Your CT Chest shows stable lung nodules. This is great news. We will do a 6 month follow up CT Chest to follow for stability. This will be due 03/2024. You will get a call to get this scheduled closer to the time . You will follow up with me 1-2 weeks after the scan has been completed to review results. Call for any unexplained weight loss or blood in your sputum. Continue using Air Supra as needed for shortness of breath or wheezing.  Call if you need maintenance inhaler. I will see you in 6 months. Please contact office for sooner follow up if symptoms do not improve or worsen or seek emergency care     I spent 25 minutes dedicated to the care of this patient on the date of this encounter to include pre-visit review of records, face-to-face time with the patient discussing conditions above, post visit ordering of testing, clinical documentation with the electronic health record, making appropriate referrals as documented, and communicating necessary information to the patient's healthcare team.        Melissa JULIANNA Lites, NP 10/12/2023  2:42 PM

## 2023-10-31 DIAGNOSIS — J4520 Mild intermittent asthma, uncomplicated: Secondary | ICD-10-CM | POA: Diagnosis not present

## 2023-10-31 DIAGNOSIS — E039 Hypothyroidism, unspecified: Secondary | ICD-10-CM | POA: Diagnosis not present

## 2023-10-31 DIAGNOSIS — I10 Essential (primary) hypertension: Secondary | ICD-10-CM | POA: Diagnosis not present

## 2023-10-31 DIAGNOSIS — E7800 Pure hypercholesterolemia, unspecified: Secondary | ICD-10-CM | POA: Diagnosis not present

## 2023-11-28 DIAGNOSIS — I10 Essential (primary) hypertension: Secondary | ICD-10-CM | POA: Diagnosis not present

## 2023-11-30 DIAGNOSIS — E7800 Pure hypercholesterolemia, unspecified: Secondary | ICD-10-CM | POA: Diagnosis not present

## 2023-11-30 DIAGNOSIS — I10 Essential (primary) hypertension: Secondary | ICD-10-CM | POA: Diagnosis not present

## 2023-11-30 DIAGNOSIS — J4520 Mild intermittent asthma, uncomplicated: Secondary | ICD-10-CM | POA: Diagnosis not present

## 2023-11-30 DIAGNOSIS — E039 Hypothyroidism, unspecified: Secondary | ICD-10-CM | POA: Diagnosis not present

## 2023-12-07 ENCOUNTER — Other Ambulatory Visit: Payer: Self-pay

## 2023-12-07 MED ORDER — BREZTRI AEROSPHERE 160-9-4.8 MCG/ACT IN AERO: 2.0000 | INHALATION_SPRAY | Freq: Two times a day (BID) | RESPIRATORY_TRACT | 3 refills | Status: AC

## 2023-12-09 DIAGNOSIS — K5730 Diverticulosis of large intestine without perforation or abscess without bleeding: Secondary | ICD-10-CM | POA: Diagnosis not present

## 2023-12-09 DIAGNOSIS — K219 Gastro-esophageal reflux disease without esophagitis: Secondary | ICD-10-CM | POA: Diagnosis not present

## 2023-12-09 DIAGNOSIS — Z860100 Personal history of colon polyps, unspecified: Secondary | ICD-10-CM | POA: Diagnosis not present

## 2023-12-14 ENCOUNTER — Telehealth: Payer: Self-pay

## 2023-12-14 MED ORDER — BREZTRI AEROSPHERE 160-9-4.8 MCG/ACT IN AERO: 2.0000 | INHALATION_SPRAY | Freq: Two times a day (BID) | RESPIRATORY_TRACT | 3 refills | Status: DC

## 2023-12-14 MED ORDER — BREZTRI AEROSPHERE 160-9-4.8 MCG/ACT IN AERO: 2.0000 | INHALATION_SPRAY | Freq: Two times a day (BID) | RESPIRATORY_TRACT | 3 refills | Status: AC

## 2023-12-14 NOTE — Telephone Encounter (Signed)
 Refill [facility] fax to [facility]

## 2023-12-14 NOTE — Addendum Note (Signed)
 Addended by: MELVENIA WILFORD SAUNDERS on: 12/14/2023 08:44 AM   Modules accepted: Orders

## 2023-12-29 ENCOUNTER — Other Ambulatory Visit: Payer: Self-pay | Admitting: Cardiology

## 2023-12-31 DIAGNOSIS — E039 Hypothyroidism, unspecified: Secondary | ICD-10-CM | POA: Diagnosis not present

## 2023-12-31 DIAGNOSIS — E7800 Pure hypercholesterolemia, unspecified: Secondary | ICD-10-CM | POA: Diagnosis not present

## 2023-12-31 DIAGNOSIS — J4520 Mild intermittent asthma, uncomplicated: Secondary | ICD-10-CM | POA: Diagnosis not present

## 2023-12-31 DIAGNOSIS — I10 Essential (primary) hypertension: Secondary | ICD-10-CM | POA: Diagnosis not present

## 2024-01-24 DIAGNOSIS — I700 Atherosclerosis of aorta: Secondary | ICD-10-CM | POA: Diagnosis not present

## 2024-01-24 DIAGNOSIS — Z8249 Family history of ischemic heart disease and other diseases of the circulatory system: Secondary | ICD-10-CM | POA: Diagnosis not present

## 2024-01-24 DIAGNOSIS — E039 Hypothyroidism, unspecified: Secondary | ICD-10-CM | POA: Diagnosis not present

## 2024-01-24 DIAGNOSIS — Z5948 Other specified lack of adequate food: Secondary | ICD-10-CM | POA: Diagnosis not present

## 2024-01-24 DIAGNOSIS — M1990 Unspecified osteoarthritis, unspecified site: Secondary | ICD-10-CM | POA: Diagnosis not present

## 2024-01-24 DIAGNOSIS — I719 Aortic aneurysm of unspecified site, without rupture: Secondary | ICD-10-CM | POA: Diagnosis not present

## 2024-01-24 DIAGNOSIS — Z59869 Financial insecurity, unspecified: Secondary | ICD-10-CM | POA: Diagnosis not present

## 2024-01-24 DIAGNOSIS — L309 Dermatitis, unspecified: Secondary | ICD-10-CM | POA: Diagnosis not present

## 2024-01-24 DIAGNOSIS — K219 Gastro-esophageal reflux disease without esophagitis: Secondary | ICD-10-CM | POA: Diagnosis not present

## 2024-01-24 DIAGNOSIS — I10 Essential (primary) hypertension: Secondary | ICD-10-CM | POA: Diagnosis not present

## 2024-01-24 DIAGNOSIS — J45909 Unspecified asthma, uncomplicated: Secondary | ICD-10-CM | POA: Diagnosis not present

## 2024-01-24 DIAGNOSIS — E785 Hyperlipidemia, unspecified: Secondary | ICD-10-CM | POA: Diagnosis not present

## 2024-01-24 DIAGNOSIS — Z5941 Food insecurity: Secondary | ICD-10-CM | POA: Diagnosis not present

## 2024-01-27 DIAGNOSIS — I10 Essential (primary) hypertension: Secondary | ICD-10-CM | POA: Diagnosis not present

## 2024-01-30 DIAGNOSIS — I10 Essential (primary) hypertension: Secondary | ICD-10-CM | POA: Diagnosis not present

## 2024-01-30 DIAGNOSIS — E7800 Pure hypercholesterolemia, unspecified: Secondary | ICD-10-CM | POA: Diagnosis not present

## 2024-01-30 DIAGNOSIS — E039 Hypothyroidism, unspecified: Secondary | ICD-10-CM | POA: Diagnosis not present

## 2024-01-30 DIAGNOSIS — J4520 Mild intermittent asthma, uncomplicated: Secondary | ICD-10-CM | POA: Diagnosis not present

## 2024-02-07 DIAGNOSIS — Z01419 Encounter for gynecological examination (general) (routine) without abnormal findings: Secondary | ICD-10-CM | POA: Diagnosis not present

## 2024-02-07 DIAGNOSIS — Z6827 Body mass index (BMI) 27.0-27.9, adult: Secondary | ICD-10-CM | POA: Diagnosis not present

## 2024-02-07 DIAGNOSIS — Z1231 Encounter for screening mammogram for malignant neoplasm of breast: Secondary | ICD-10-CM | POA: Diagnosis not present

## 2024-03-22 ENCOUNTER — Ambulatory Visit: Admitting: Acute Care

## 2024-04-13 ENCOUNTER — Other Ambulatory Visit

## 2024-04-21 ENCOUNTER — Ambulatory Visit: Admitting: Acute Care
